# Patient Record
Sex: Female | Born: 1935 | Race: White | Hispanic: No | Marital: Married | Smoking: Former smoker
Health system: Southern US, Community
[De-identification: ages and names within clinical notes are randomized; demographics above are authoritative.]

## PROBLEM LIST (undated history)

## (undated) DIAGNOSIS — C679 Malignant neoplasm of bladder, unspecified: Secondary | ICD-10-CM

## (undated) DIAGNOSIS — M1990 Unspecified osteoarthritis, unspecified site: Secondary | ICD-10-CM

## (undated) DIAGNOSIS — I10 Essential (primary) hypertension: Secondary | ICD-10-CM

## (undated) DIAGNOSIS — F32A Depression, unspecified: Secondary | ICD-10-CM

## (undated) DIAGNOSIS — C801 Malignant (primary) neoplasm, unspecified: Secondary | ICD-10-CM

## (undated) DIAGNOSIS — F329 Major depressive disorder, single episode, unspecified: Secondary | ICD-10-CM

## (undated) HISTORY — PX: APPENDECTOMY: SHX54

## (undated) HISTORY — PX: CYSTOSCOPY TUMOR / CONDYLOMATA W/ LASER: SUR373

---
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
## 2014-01-02 ENCOUNTER — Other Ambulatory Visit: Payer: Self-pay | Admitting: Orthopedic Surgery

## 2014-01-12 ENCOUNTER — Encounter (HOSPITAL_COMMUNITY): Payer: Self-pay

## 2014-01-12 ENCOUNTER — Encounter (HOSPITAL_COMMUNITY)
Admission: RE | Admit: 2014-01-12 | Discharge: 2014-01-12 | Disposition: A | Discharge status: Home / Self Care | Payer: Medicare HMO | Source: Ambulatory Visit | Attending: Orthopedic Surgery | Admitting: Orthopedic Surgery

## 2014-01-12 ENCOUNTER — Other Ambulatory Visit: Payer: Self-pay | Admitting: Orthopedic Surgery

## 2014-01-12 DIAGNOSIS — I10 Essential (primary) hypertension: Secondary | ICD-10-CM | POA: Insufficient documentation

## 2014-01-12 DIAGNOSIS — Z01812 Encounter for preprocedural laboratory examination: Secondary | ICD-10-CM | POA: Diagnosis not present

## 2014-01-12 DIAGNOSIS — Z01818 Encounter for other preprocedural examination: Secondary | ICD-10-CM | POA: Diagnosis present

## 2014-01-12 HISTORY — DX: Malignant neoplasm of bladder, unspecified: C67.9

## 2014-01-12 HISTORY — DX: Depression, unspecified: F32.A

## 2014-01-12 HISTORY — DX: Major depressive disorder, single episode, unspecified: F32.9

## 2014-01-12 HISTORY — DX: Malignant (primary) neoplasm, unspecified: C80.1

## 2014-01-12 HISTORY — DX: Essential (primary) hypertension: I10

## 2014-01-12 HISTORY — DX: Unspecified osteoarthritis, unspecified site: M19.90

## 2014-01-12 LAB — URINALYSIS, ROUTINE W REFLEX MICROSCOPIC
Bilirubin Urine: NEGATIVE
Glucose, UA: NEGATIVE mg/dL
Hgb urine dipstick: NEGATIVE
KETONES UR: NEGATIVE mg/dL
Leukocytes, UA: NEGATIVE
NITRITE: NEGATIVE
PH: 6.5 (ref 5.0–8.0)
Protein, ur: NEGATIVE mg/dL
Specific Gravity, Urine: 1.016 (ref 1.005–1.030)
Urobilinogen, UA: 0.2 mg/dL (ref 0.0–1.0)

## 2014-01-12 LAB — CBC WITH DIFFERENTIAL/PLATELET
Basophils Absolute: 0 10*3/uL (ref 0.0–0.1)
Basophils Relative: 0 % (ref 0–1)
EOS PCT: 1 % (ref 0–5)
Eosinophils Absolute: 0.1 10*3/uL (ref 0.0–0.7)
HEMATOCRIT: 44.5 % (ref 36.0–46.0)
Hemoglobin: 15.2 g/dL — ABNORMAL HIGH (ref 12.0–15.0)
LYMPHS PCT: 41 % (ref 12–46)
Lymphs Abs: 4.7 10*3/uL — ABNORMAL HIGH (ref 0.7–4.0)
MCH: 30.5 pg (ref 26.0–34.0)
MCHC: 34.2 g/dL (ref 30.0–36.0)
MCV: 89.2 fL (ref 78.0–100.0)
MONO ABS: 0.8 10*3/uL (ref 0.1–1.0)
Monocytes Relative: 7 % (ref 3–12)
Neutro Abs: 5.9 10*3/uL (ref 1.7–7.7)
Neutrophils Relative %: 51 % (ref 43–77)
Platelets: 224 10*3/uL (ref 150–400)
RBC: 4.99 MIL/uL (ref 3.87–5.11)
RDW: 13 % (ref 11.5–15.5)
WBC: 11.4 10*3/uL — ABNORMAL HIGH (ref 4.0–10.5)

## 2014-01-12 LAB — COMPREHENSIVE METABOLIC PANEL
ALT: 45 U/L — AB (ref 0–35)
AST: 55 U/L — ABNORMAL HIGH (ref 0–37)
Albumin: 4.2 g/dL (ref 3.5–5.2)
Alkaline Phosphatase: 99 U/L (ref 39–117)
Anion gap: 16 — ABNORMAL HIGH (ref 5–15)
BUN: 12 mg/dL (ref 6–23)
CALCIUM: 9.1 mg/dL (ref 8.4–10.5)
CO2: 23 meq/L (ref 19–32)
CREATININE: 0.56 mg/dL (ref 0.50–1.10)
Chloride: 103 mEq/L (ref 96–112)
GFR, EST NON AFRICAN AMERICAN: 86 mL/min — AB (ref >90)
Glucose, Bld: 104 mg/dL — ABNORMAL HIGH (ref 70–99)
Potassium: 4 mEq/L (ref 3.7–5.3)
SODIUM: 142 meq/L (ref 137–147)
Total Bilirubin: 0.6 mg/dL (ref 0.3–1.2)
Total Protein: 7.7 g/dL (ref 6.0–8.3)

## 2014-01-12 LAB — PROTIME-INR
INR: 1.14 (ref 0.00–1.49)
Prothrombin Time: 14.6 seconds (ref 11.6–15.2)

## 2014-01-12 LAB — SURGICAL PCR SCREEN
MRSA, PCR: NEGATIVE
Staphylococcus aureus: NEGATIVE

## 2014-01-12 LAB — TYPE AND SCREEN
ABO/RH(D): O POS
Antibody Screen: NEGATIVE

## 2014-01-12 LAB — ABO/RH: ABO/RH(D): O POS

## 2014-01-12 LAB — APTT: aPTT: 32 seconds (ref 24–37)

## 2014-01-12 NOTE — Pre-Procedure Instructions (Signed)
Renee Hernandez  01/12/2014   Your procedure is scheduled on:  Monday, August 17  Report to [facility] Admitting at [phone] AM.  Call this number if you have problems the morning of surgery: [phone]   Remember:   Do not eat food or drink liquids after midnight.Sunday night   Take these medicines the morning of surgery with A SIP OF WATER:Prozac   Do not wear jewelry, make-up or nail polish.  Do not wear lotions, powders, or perfumes. You may wear deodorant.  Do not shave 48 hours prior to surgery.    Do not bring valuables to the hospital.  [facility] is not responsible for any belongings or valuables.               Contacts, dentures or bridgework may not be worn into surgery.  Leave suitcase in the car. After surgery it may be brought to your room.  For patients admitted to the hospital, discharge time is determined by your   treatment team.        Special Instructions: [facility] - Preparing for Surgery  Before surgery, you can play an important role.  Because skin is not sterile, your skin needs to be as free of germs as possible.  You can reduce the number of germs on you skin by washing with CHG (chlorahexidine gluconate) soap before surgery.  CHG is an antiseptic cleaner which kills germs and bonds with the skin to continue killing germs even after washing.  Please DO NOT use if you have an allergy to CHG or antibacterial soaps.  If your skin becomes reddened/irritated stop using the CHG and inform your nurse when you arrive at Short Stay.  Do not shave (including legs and underarms) for at least 48 hours prior to the first CHG shower.  You may shave your face.  Please follow these instructions carefully:   1.  Shower with CHG Soap the night before surgery and the   morning of Surgery.  2.  If you choose to wash your hair, wash your hair first as usual with your  normal shampoo.  3.  After you shampoo, rinse your hair and body thoroughly to remove the  Shampoo.  4.  Use CHG as you would any other liquid soap.  You can apply chg directly  to the skin and wash gently with scrungie or a clean washcloth.  5.  Apply the CHG Soap to your body ONLY FROM THE NECK DOWN.   Do not use on open wounds or open sores.  Avoid contact with your eyes,  ears, mouth and genitals (private parts).  Wash genitals (private parts)  with your normal soap.  6.  Wash thoroughly, paying special attention to the area where your surgery   will be performed.  7.  Thoroughly rinse your body with warm water from the neck down.  8.  DO NOT shower/wash with your normal soap after using and rinsing off  the CHG Soap.  9.  Pat yourself dry with a clean towel.            10.  Wear clean pajamas.            11 .  Place clean sheets on your bed the night of your first shower and do not   sleep with pets.  Day of Surgery  Do not apply any lotions/deoderants the morning of surgery.  Please wear clean clothes to the hospital/surgery center.  Please read over the following fact sheets that you were given: Pain Booklet, Coughing and Deep Breathing, Blood Transfusion Information, Total Joint Packet and Surgical Site Infection Prevention

## 2014-01-12 NOTE — Progress Notes (Signed)
Dr Ruel Favors office assistant  advised of Penicillin allergy. States chart will be updated and pre op Abx changed.

## 2014-01-13 LAB — URINE CULTURE: Colony Count: 15000

## 2014-01-16 NOTE — Progress Notes (Signed)
Anesthesia Chart Review:  Patient is a 80 year old female scheduled for right TKA on 01/22/14 by Dr. Ronnie Derby.  History includes former smoker, HTN, bladder cancer, arthritis, depression, appendectomy.  BMI is consistent with obesity. PCP is listed as Dr. Kennith Maes in [place].  Patient was referred to cardiologist Dr. Jimmie Molly (by Dr. Jone Baseman) with [facility]) for a preoperative evaluation due to HTN.  She was ultimately cleared with PRN follow-up recommended. BP at PAT was recorded at 177/93.  EKG on 12/25/13 [facility]) showed: SR, low voltage in precordial leads, non-specific T wave abnormality.  CXR on 01/12/14 showed: FINDINGS: Aortic tortuosity, dilatation, and advanced atherosclerosis. Enlarged cardiac silhouette. No confluent airspace opacity, pleural effusion, or pneumothorax. Osteopenia and mild multilevel degenerative changes. IMPRESSION: Prominent cardiomediastinal contours. Aortic atherosclerosis, tortuosity, and dilatation. No focal consolidation. (There are no comparison radiology films in PACS.) CXR film reviewed with anesthesiologist Dr. Orene Desanctis.  Findings should not interfere with proceeding with this surgery from an anesthesia standpoint.  CXR was ordered by Carlynn Spry, PA-C, so will defer additional recommendations to him and/or Dr. Ronnie Derby.  Preoperative labs noted.     George Hugh [facility] Short Stay Center/Anesthesiology Phone [phone] 01/16/2014 11:41 AM

## 2014-01-21 MED ORDER — SODIUM CHLORIDE 0.9 % IV SOLN: INTRAVENOUS | Status: DC

## 2014-01-21 MED ORDER — BUPIVACAINE LIPOSOME 1.3 % IJ SUSP
20.0000 mL | Freq: Once | INTRAMUSCULAR | Status: DC
  Filled 2014-01-21: qty 20

## 2014-01-21 MED ORDER — VANCOMYCIN HCL 10 G IV SOLR
1500.0000 mg | INTRAVENOUS | Status: AC
  Administered 2014-01-22: 1500 mg via INTRAVENOUS
  Filled 2014-01-21: qty 1500

## 2014-01-21 MED ORDER — TRANEXAMIC ACID 100 MG/ML IV SOLN
1000.0000 mg | INTRAVENOUS | Status: AC
  Administered 2014-01-22: 1000 mg via INTRAVENOUS
  Filled 2014-01-21: qty 10

## 2014-01-21 MED ORDER — CHLORHEXIDINE GLUCONATE 4 % EX LIQD
60.0000 mL | Freq: Once | CUTANEOUS | Status: DC
  Filled 2014-01-21: qty 60

## 2014-01-22 ENCOUNTER — Encounter (HOSPITAL_COMMUNITY): Payer: Self-pay | Admitting: *Deleted

## 2014-01-22 ENCOUNTER — Encounter (HOSPITAL_COMMUNITY)
Admission: RE | Disposition: A | Discharge status: Skilled Nursing Facility | Payer: Self-pay | Source: Ambulatory Visit | Attending: Orthopedic Surgery

## 2014-01-22 ENCOUNTER — Inpatient Hospital Stay (HOSPITAL_COMMUNITY)
Admission: RE | Admit: 2014-01-22 | Discharge: 2014-01-25 | DRG: 470 | Disposition: A | Discharge status: Skilled Nursing Facility | Payer: Medicare HMO | Source: Ambulatory Visit | Attending: Orthopedic Surgery | Admitting: Orthopedic Surgery

## 2014-01-22 ENCOUNTER — Inpatient Hospital Stay (HOSPITAL_COMMUNITY): Payer: Medicare HMO | Admitting: Anesthesiology

## 2014-01-22 ENCOUNTER — Encounter (HOSPITAL_COMMUNITY): Payer: Medicare HMO | Admitting: Vascular Surgery

## 2014-01-22 DIAGNOSIS — Z87891 Personal history of nicotine dependence: Secondary | ICD-10-CM

## 2014-01-22 DIAGNOSIS — Z886 Allergy status to analgesic agent status: Secondary | ICD-10-CM

## 2014-01-22 DIAGNOSIS — I10 Essential (primary) hypertension: Secondary | ICD-10-CM | POA: Diagnosis present

## 2014-01-22 DIAGNOSIS — M1710 Unilateral primary osteoarthritis, unspecified knee: Secondary | ICD-10-CM | POA: Diagnosis present

## 2014-01-22 DIAGNOSIS — D62 Acute posthemorrhagic anemia: Secondary | ICD-10-CM | POA: Diagnosis not present

## 2014-01-22 DIAGNOSIS — F3289 Other specified depressive episodes: Secondary | ICD-10-CM | POA: Diagnosis present

## 2014-01-22 DIAGNOSIS — Z96651 Presence of right artificial knee joint: Secondary | ICD-10-CM

## 2014-01-22 DIAGNOSIS — Z8551 Personal history of malignant neoplasm of bladder: Secondary | ICD-10-CM | POA: Diagnosis not present

## 2014-01-22 DIAGNOSIS — Z880 Allergy status to penicillin: Secondary | ICD-10-CM

## 2014-01-22 DIAGNOSIS — Z79899 Other long term (current) drug therapy: Secondary | ICD-10-CM

## 2014-01-22 DIAGNOSIS — Z96659 Presence of unspecified artificial knee joint: Secondary | ICD-10-CM

## 2014-01-22 DIAGNOSIS — F329 Major depressive disorder, single episode, unspecified: Secondary | ICD-10-CM | POA: Diagnosis present

## 2014-01-22 HISTORY — PX: TOTAL KNEE ARTHROPLASTY: SHX125

## 2014-01-22 LAB — CBC
HCT: 42.4 % (ref 36.0–46.0)
Hemoglobin: 13.9 g/dL (ref 12.0–15.0)
MCH: 29.9 pg (ref 26.0–34.0)
MCHC: 32.8 g/dL (ref 30.0–36.0)
MCV: 91.2 fL (ref 78.0–100.0)
Platelets: 191 K/uL (ref 150–400)
RBC: 4.65 MIL/uL (ref 3.87–5.11)
RDW: 13.3 % (ref 11.5–15.5)
WBC: 10.2 K/uL (ref 4.0–10.5)

## 2014-01-22 LAB — CREATININE, SERUM
CREATININE: 0.59 mg/dL (ref 0.50–1.10)
GFR calc Af Amer: >90 mL/min (ref >90)
GFR, EST NON AFRICAN AMERICAN: 85 mL/min — AB (ref >90)

## 2014-01-22 SURGERY — ARTHROPLASTY, KNEE, TOTAL
Anesthesia: General | Laterality: Right

## 2014-01-22 MED ORDER — SCOPOLAMINE 1 MG/3DAYS TD PT72
MEDICATED_PATCH | TRANSDERMAL | Status: AC
  Filled 2014-01-22: qty 1

## 2014-01-22 MED ORDER — ACETAMINOPHEN 650 MG RE SUPP: 650.0000 mg | Freq: Four times a day (QID) | RECTAL | Status: DC | PRN

## 2014-01-22 MED ORDER — FENTANYL CITRATE 0.05 MG/ML IJ SOLN
INTRAMUSCULAR | Status: DC | PRN
  Administered 2014-01-22: 50 ug via INTRAVENOUS
  Administered 2014-01-22: 100 ug via INTRAVENOUS

## 2014-01-22 MED ORDER — PNEUMOCOCCAL VAC POLYVALENT 25 MCG/0.5ML IJ INJ
0.5000 mL | INJECTION | INTRAMUSCULAR | Status: DC
  Filled 2014-01-22: qty 0.5

## 2014-01-22 MED ORDER — ROCURONIUM BROMIDE 100 MG/10ML IV SOLN
INTRAVENOUS | Status: DC | PRN
  Administered 2014-01-22: 50 mg via INTRAVENOUS

## 2014-01-22 MED ORDER — ACETAMINOPHEN 325 MG PO TABS: 650.0000 mg | ORAL_TABLET | Freq: Four times a day (QID) | ORAL | Status: DC | PRN

## 2014-01-22 MED ORDER — GLYCOPYRROLATE 0.2 MG/ML IJ SOLN
INTRAMUSCULAR | Status: AC
  Filled 2014-01-22: qty 3

## 2014-01-22 MED ORDER — LIDOCAINE HCL (CARDIAC) 20 MG/ML IV SOLN
INTRAVENOUS | Status: AC
  Filled 2014-01-22: qty 5

## 2014-01-22 MED ORDER — MIDAZOLAM HCL 2 MG/2ML IJ SOLN
INTRAMUSCULAR | Status: AC
  Administered 2014-01-22: 1.5 mg
  Filled 2014-01-22: qty 2

## 2014-01-22 MED ORDER — DOCUSATE SODIUM 100 MG PO CAPS
100.0000 mg | ORAL_CAPSULE | Freq: Two times a day (BID) | ORAL | Status: DC
  Administered 2014-01-22 – 2014-01-25 (×7): 100 mg via ORAL
  Filled 2014-01-22 (×7): qty 1

## 2014-01-22 MED ORDER — LACTATED RINGERS IV SOLN: Freq: Once | INTRAVENOUS | Status: DC

## 2014-01-22 MED ORDER — HYDRALAZINE HCL 20 MG/ML IJ SOLN
INTRAMUSCULAR | Status: AC
  Filled 2014-01-22: qty 1

## 2014-01-22 MED ORDER — FENTANYL CITRATE 0.05 MG/ML IJ SOLN
INTRAMUSCULAR | Status: AC
  Filled 2014-01-22: qty 5

## 2014-01-22 MED ORDER — SODIUM CHLORIDE 0.9 % IR SOLN
Status: DC | PRN
  Administered 2014-01-22: 1

## 2014-01-22 MED ORDER — ONDANSETRON HCL 4 MG/2ML IJ SOLN
INTRAMUSCULAR | Status: AC
  Filled 2014-01-22: qty 2

## 2014-01-22 MED ORDER — METOCLOPRAMIDE HCL 10 MG PO TABS
5.0000 mg | ORAL_TABLET | Freq: Three times a day (TID) | ORAL | Status: DC | PRN
Start: 2014-01-22 — End: 2014-01-25

## 2014-01-22 MED ORDER — GLYCOPYRROLATE 0.2 MG/ML IJ SOLN
INTRAMUSCULAR | Status: DC | PRN
  Administered 2014-01-22: 0.6 mg via INTRAVENOUS

## 2014-01-22 MED ORDER — LOSARTAN POTASSIUM 50 MG PO TABS: 100.0000 mg | ORAL_TABLET | Freq: Once | ORAL | Status: DC

## 2014-01-22 MED ORDER — ESMOLOL HCL 10 MG/ML IV SOLN
INTRAVENOUS | Status: AC
  Filled 2014-01-22: qty 10

## 2014-01-22 MED ORDER — FLUOXETINE HCL 20 MG PO CAPS
20.0000 mg | ORAL_CAPSULE | Freq: Every day | ORAL | Status: DC
  Administered 2014-01-23 – 2014-01-25 (×3): 20 mg via ORAL
  Filled 2014-01-22 (×3): qty 1

## 2014-01-22 MED ORDER — BUPIVACAINE LIPOSOME 1.3 % IJ SUSP
INTRAMUSCULAR | Status: DC | PRN
  Administered 2014-01-22: 20 mL

## 2014-01-22 MED ORDER — EPHEDRINE SULFATE 50 MG/ML IJ SOLN
INTRAMUSCULAR | Status: DC | PRN
  Administered 2014-01-22 (×3): 5 mg via INTRAVENOUS

## 2014-01-22 MED ORDER — MIDAZOLAM HCL 2 MG/2ML IJ SOLN: 1.5000 mg | Freq: Once | INTRAMUSCULAR | Status: AC

## 2014-01-22 MED ORDER — DEXTROSE 5 % IV SOLN
500.0000 mg | Freq: Four times a day (QID) | INTRAVENOUS | Status: DC | PRN
  Filled 2014-01-22: qty 5

## 2014-01-22 MED ORDER — HYDROCHLOROTHIAZIDE 25 MG PO TABS: 25.0000 mg | ORAL_TABLET | Freq: Once | ORAL | Status: DC

## 2014-01-22 MED ORDER — VANCOMYCIN HCL IN DEXTROSE 1-5 GM/200ML-% IV SOLN
1000.0000 mg | Freq: Two times a day (BID) | INTRAVENOUS | Status: AC
  Administered 2014-01-22: 1000 mg via INTRAVENOUS
  Filled 2014-01-22: qty 200

## 2014-01-22 MED ORDER — MIDAZOLAM HCL 2 MG/2ML IJ SOLN
INTRAMUSCULAR | Status: AC
  Filled 2014-01-22: qty 2

## 2014-01-22 MED ORDER — DEXMEDETOMIDINE HCL IN NACL 200 MCG/50ML IV SOLN
INTRAVENOUS | Status: AC
  Filled 2014-01-22: qty 50

## 2014-01-22 MED ORDER — LACTATED RINGERS IV SOLN
Freq: Once | INTRAVENOUS | Status: AC
  Administered 2014-01-22: 09:00:00 via INTRAVENOUS

## 2014-01-22 MED ORDER — ONDANSETRON HCL 4 MG PO TABS: 4.0000 mg | ORAL_TABLET | Freq: Four times a day (QID) | ORAL | Status: DC | PRN

## 2014-01-22 MED ORDER — PROMETHAZINE HCL 25 MG/ML IJ SOLN: 6.2500 mg | INTRAMUSCULAR | Status: DC | PRN

## 2014-01-22 MED ORDER — LACTATED RINGERS IV SOLN
INTRAVENOUS | Status: DC | PRN
  Administered 2014-01-22 (×2): via INTRAVENOUS

## 2014-01-22 MED ORDER — DEXMEDETOMIDINE HCL 200 MCG/2ML IV SOLN
INTRAVENOUS | Status: DC | PRN
  Administered 2014-01-22: 4 ug via INTRAVENOUS
  Administered 2014-01-22: 2 ug via INTRAVENOUS
  Administered 2014-01-22: 4 ug via INTRAVENOUS

## 2014-01-22 MED ORDER — FENTANYL CITRATE 0.05 MG/ML IJ SOLN: 25.0000 ug | INTRAMUSCULAR | Status: DC | PRN

## 2014-01-22 MED ORDER — LOSARTAN POTASSIUM-HCTZ 100-25 MG PO TABS: 1.0000 | ORAL_TABLET | Freq: Every day | ORAL | Status: DC

## 2014-01-22 MED ORDER — BUPIVACAINE-EPINEPHRINE 0.5% -1:200000 IJ SOLN
INTRAMUSCULAR | Status: DC | PRN
  Administered 2014-01-22: 30 mL

## 2014-01-22 MED ORDER — BUPIVACAINE-EPINEPHRINE (PF) 0.5% -1:200000 IJ SOLN
INTRAMUSCULAR | Status: DC | PRN
  Administered 2014-01-22: 25 mL via PERINEURAL

## 2014-01-22 MED ORDER — HYDROMORPHONE HCL PF 1 MG/ML IJ SOLN
1.0000 mg | INTRAMUSCULAR | Status: DC | PRN
  Administered 2014-01-23 – 2014-01-24 (×2): 1 mg via INTRAVENOUS
  Filled 2014-01-22 (×2): qty 1

## 2014-01-22 MED ORDER — NEOSTIGMINE METHYLSULFATE 10 MG/10ML IV SOLN
INTRAVENOUS | Status: DC | PRN
  Administered 2014-01-22: 4 mg via INTRAVENOUS

## 2014-01-22 MED ORDER — NEOSTIGMINE METHYLSULFATE 10 MG/10ML IV SOLN
INTRAVENOUS | Status: AC
  Filled 2014-01-22: qty 1

## 2014-01-22 MED ORDER — FENTANYL CITRATE 0.05 MG/ML IJ SOLN
75.0000 ug | Freq: Once | INTRAMUSCULAR | Status: AC
  Administered 2014-01-22: 75 ug via INTRAVENOUS

## 2014-01-22 MED ORDER — ENOXAPARIN SODIUM 30 MG/0.3ML ~~LOC~~ SOLN
30.0000 mg | Freq: Two times a day (BID) | SUBCUTANEOUS | Status: DC
  Administered 2014-01-23 – 2014-01-25 (×4): 30 mg via SUBCUTANEOUS
  Filled 2014-01-22 (×7): qty 0.3

## 2014-01-22 MED ORDER — HYDROCHLOROTHIAZIDE 25 MG PO TABS
25.0000 mg | ORAL_TABLET | Freq: Every day | ORAL | Status: DC
  Administered 2014-01-22 – 2014-01-25 (×4): 25 mg via ORAL
  Filled 2014-01-22 (×4): qty 1

## 2014-01-22 MED ORDER — PHENOL 1.4 % MT LIQD: 1.0000 | OROMUCOSAL | Status: DC | PRN

## 2014-01-22 MED ORDER — ONDANSETRON HCL 4 MG/2ML IJ SOLN: 4.0000 mg | Freq: Four times a day (QID) | INTRAMUSCULAR | Status: DC | PRN

## 2014-01-22 MED ORDER — LIDOCAINE HCL (CARDIAC) 20 MG/ML IV SOLN
INTRAVENOUS | Status: DC | PRN
  Administered 2014-01-22: 40 mg via INTRAVENOUS

## 2014-01-22 MED ORDER — DEXAMETHASONE SODIUM PHOSPHATE 4 MG/ML IJ SOLN
INTRAMUSCULAR | Status: AC
  Filled 2014-01-22: qty 1

## 2014-01-22 MED ORDER — BISACODYL 5 MG PO TBEC: 5.0000 mg | DELAYED_RELEASE_TABLET | Freq: Every day | ORAL | Status: DC | PRN

## 2014-01-22 MED ORDER — ROCURONIUM BROMIDE 50 MG/5ML IV SOLN
INTRAVENOUS | Status: AC
  Filled 2014-01-22: qty 1

## 2014-01-22 MED ORDER — METOCLOPRAMIDE HCL 5 MG/ML IJ SOLN
5.0000 mg | Freq: Three times a day (TID) | INTRAMUSCULAR | Status: DC | PRN
Start: 2014-01-22 — End: 2014-01-25

## 2014-01-22 MED ORDER — MEPERIDINE HCL 25 MG/ML IJ SOLN: 6.2500 mg | INTRAMUSCULAR | Status: DC | PRN

## 2014-01-22 MED ORDER — MENTHOL 3 MG MT LOZG
1.0000 | LOZENGE | OROMUCOSAL | Status: DC | PRN
Start: 2014-01-22 — End: 2014-01-25

## 2014-01-22 MED ORDER — DIPHENHYDRAMINE HCL 12.5 MG/5ML PO ELIX: 12.5000 mg | ORAL_SOLUTION | ORAL | Status: DC | PRN

## 2014-01-22 MED ORDER — SENNOSIDES-DOCUSATE SODIUM 8.6-50 MG PO TABS
1.0000 | ORAL_TABLET | Freq: Every evening | ORAL | Status: DC | PRN
Start: 2014-01-22 — End: 2014-01-25

## 2014-01-22 MED ORDER — FUROSEMIDE 20 MG PO TABS
20.0000 mg | ORAL_TABLET | Freq: Every day | ORAL | Status: DC
  Administered 2014-01-22 – 2014-01-25 (×4): 20 mg via ORAL
  Filled 2014-01-22 (×4): qty 1

## 2014-01-22 MED ORDER — DIPHENHYDRAMINE HCL 50 MG/ML IJ SOLN
INTRAMUSCULAR | Status: DC | PRN
  Administered 2014-01-22: 6 mg via INTRAVENOUS

## 2014-01-22 MED ORDER — ZOLPIDEM TARTRATE 5 MG PO TABS: 5.0000 mg | ORAL_TABLET | Freq: Every evening | ORAL | Status: DC | PRN

## 2014-01-22 MED ORDER — SCOPOLAMINE 1 MG/3DAYS TD PT72
1.0000 | MEDICATED_PATCH | TRANSDERMAL | Status: DC
  Administered 2014-01-22: 1 via TRANSDERMAL

## 2014-01-22 MED ORDER — FENTANYL CITRATE 0.05 MG/ML IJ SOLN
INTRAMUSCULAR | Status: AC
  Administered 2014-01-22: 75 ug via INTRAVENOUS
  Filled 2014-01-22: qty 2

## 2014-01-22 MED ORDER — PROPOFOL 10 MG/ML IV BOLUS
INTRAVENOUS | Status: AC
  Filled 2014-01-22: qty 20

## 2014-01-22 MED ORDER — BUPIVACAINE-EPINEPHRINE (PF) 0.5% -1:200000 IJ SOLN
INTRAMUSCULAR | Status: AC
  Filled 2014-01-22: qty 30

## 2014-01-22 MED ORDER — ALUM & MAG HYDROXIDE-SIMETH 200-200-20 MG/5ML PO SUSP
30.0000 mL | ORAL | Status: DC | PRN
Start: 2014-01-22 — End: 2014-01-25

## 2014-01-22 MED ORDER — LOSARTAN POTASSIUM 50 MG PO TABS
100.0000 mg | ORAL_TABLET | Freq: Every day | ORAL | Status: DC
  Administered 2014-01-22 – 2014-01-25 (×4): 100 mg via ORAL
  Filled 2014-01-22 (×4): qty 2

## 2014-01-22 MED ORDER — OXYCODONE HCL ER 10 MG PO T12A
10.0000 mg | EXTENDED_RELEASE_TABLET | Freq: Two times a day (BID) | ORAL | Status: DC
  Administered 2014-01-22 – 2014-01-23 (×2): 10 mg via ORAL
  Filled 2014-01-22 (×2): qty 1

## 2014-01-22 MED ORDER — OXYCODONE HCL 5 MG PO TABS
5.0000 mg | ORAL_TABLET | ORAL | Status: DC | PRN
  Administered 2014-01-22 – 2014-01-23 (×4): 10 mg via ORAL
  Filled 2014-01-22 (×3): qty 2

## 2014-01-22 MED ORDER — PROPOFOL 10 MG/ML IV BOLUS
INTRAVENOUS | Status: DC | PRN
  Administered 2014-01-22: 130 mg via INTRAVENOUS

## 2014-01-22 MED ORDER — ONDANSETRON HCL 4 MG/2ML IJ SOLN
INTRAMUSCULAR | Status: DC | PRN
  Administered 2014-01-22: 4 mg via INTRAVENOUS

## 2014-01-22 MED ORDER — OXYCODONE HCL 5 MG PO TABS
ORAL_TABLET | ORAL | Status: AC
  Administered 2014-01-22: 10 mg via ORAL
  Filled 2014-01-22: qty 2

## 2014-01-22 MED ORDER — SODIUM CHLORIDE 0.9 % IV SOLN
INTRAVENOUS | Status: DC
  Administered 2014-01-22: 15:00:00 via INTRAVENOUS

## 2014-01-22 MED ORDER — METHOCARBAMOL 500 MG PO TABS
500.0000 mg | ORAL_TABLET | Freq: Four times a day (QID) | ORAL | Status: DC | PRN
  Administered 2014-01-22 (×2): 500 mg via ORAL
  Filled 2014-01-22: qty 1

## 2014-01-22 MED ORDER — FLEET ENEMA 7-19 GM/118ML RE ENEM: 1.0000 | ENEMA | Freq: Once | RECTAL | Status: AC | PRN

## 2014-01-22 MED ORDER — HYDRALAZINE HCL 20 MG/ML IJ SOLN
INTRAMUSCULAR | Status: DC | PRN
  Administered 2014-01-22 (×4): 5 mg via INTRAVENOUS

## 2014-01-22 MED ORDER — METHOCARBAMOL 500 MG PO TABS
ORAL_TABLET | ORAL | Status: AC
  Administered 2014-01-22: 500 mg via ORAL
  Filled 2014-01-22: qty 1

## 2014-01-22 MED ORDER — DEXAMETHASONE SODIUM PHOSPHATE 4 MG/ML IJ SOLN
INTRAMUSCULAR | Status: DC | PRN
  Administered 2014-01-22: 4 mg via INTRAVENOUS

## 2014-01-22 MED ORDER — DIPHENHYDRAMINE HCL 50 MG/ML IJ SOLN
INTRAMUSCULAR | Status: AC
  Filled 2014-01-22: qty 1

## 2014-01-22 SURGICAL SUPPLY — 58 items
BANDAGE ESMARK 6X9 LF (GAUZE/BANDAGES/DRESSINGS) ×1 IMPLANT
BLADE SAGITTAL 13X1.27X60 (BLADE) ×2 IMPLANT
BLADE SAW SGTL 83.5X18.5 (BLADE) ×2 IMPLANT
BNDG ADH 5X4 AIR PERM ELC (GAUZE/BANDAGES/DRESSINGS) ×1
BNDG CMPR 9X6 STRL LF SNTH (GAUZE/BANDAGES/DRESSINGS) ×1
BNDG COHESIVE 4X5 WHT NS (GAUZE/BANDAGES/DRESSINGS) ×1 IMPLANT
BNDG ESMARK 6X9 LF (GAUZE/BANDAGES/DRESSINGS) ×2
BOWL SMART MIX CTS (DISPOSABLE) ×2 IMPLANT
CAP POR TM CP VIT E LN CER HD ×1 IMPLANT
CEMENT BONE SIMPLEX SPEEDSET (Cement) ×4 IMPLANT
COVER SURGICAL LIGHT HANDLE (MISCELLANEOUS) ×2 IMPLANT
CUFF TOURNIQUET SINGLE 34IN LL (TOURNIQUET CUFF) ×2 IMPLANT
DRAPE EXTREMITY T 121X128X90 (DRAPE) ×2 IMPLANT
DRAPE INCISE IOBAN 66X45 STRL (DRAPES) ×4 IMPLANT
DRAPE PROXIMA HALF (DRAPES) ×2 IMPLANT
DRAPE U-SHAPE 47X51 STRL (DRAPES) ×2 IMPLANT
DRSG ADAPTIC 3X8 NADH LF (GAUZE/BANDAGES/DRESSINGS) ×2 IMPLANT
DRSG PAD ABDOMINAL 8X10 ST (GAUZE/BANDAGES/DRESSINGS) ×2 IMPLANT
DURAPREP 26ML APPLICATOR (WOUND CARE) ×4 IMPLANT
ELECT REM PT RETURN 9FT ADLT (ELECTROSURGICAL) ×2
ELECTRODE REM PT RTRN 9FT ADLT (ELECTROSURGICAL) ×1 IMPLANT
EVACUATOR 1/8 PVC DRAIN (DRAIN) ×2 IMPLANT
GAUZE SPONGE 4X4 12PLY STRL (GAUZE/BANDAGES/DRESSINGS) ×2 IMPLANT
GAUZE SPONGE 4X4 16PLY XRAY LF (GAUZE/BANDAGES/DRESSINGS) ×1 IMPLANT
GLOVE BIOGEL M 7.0 STRL (GLOVE) ×3 IMPLANT
GLOVE BIOGEL PI IND STRL 7.5 (GLOVE) IMPLANT
GLOVE BIOGEL PI IND STRL 8.5 (GLOVE) ×2 IMPLANT
GLOVE BIOGEL PI INDICATOR 7.5 (GLOVE) ×3
GLOVE BIOGEL PI INDICATOR 8.5 (GLOVE) ×4
GLOVE SURG ORTHO 8.0 STRL STRW (GLOVE) ×5 IMPLANT
GOWN STRL REUS W/ TWL LRG LVL3 (GOWN DISPOSABLE) ×2 IMPLANT
GOWN STRL REUS W/ TWL XL LVL3 (GOWN DISPOSABLE) ×2 IMPLANT
GOWN STRL REUS W/TWL LRG LVL3 (GOWN DISPOSABLE) ×4
GOWN STRL REUS W/TWL XL LVL3 (GOWN DISPOSABLE) ×4
HANDPIECE INTERPULSE COAX TIP (DISPOSABLE) ×2
HOOD PEEL AWAY FACE SHEILD DIS (HOOD) ×8 IMPLANT
KIT BASIN OR (CUSTOM PROCEDURE TRAY) ×2 IMPLANT
KIT ROOM TURNOVER OR (KITS) ×2 IMPLANT
MANIFOLD NEPTUNE II (INSTRUMENTS) ×2 IMPLANT
NEEDLE 22X1 1/2 (OR ONLY) (NEEDLE) ×4 IMPLANT
NS IRRIG 1000ML POUR BTL (IV SOLUTION) ×2 IMPLANT
PACK TOTAL JOINT (CUSTOM PROCEDURE TRAY) ×2 IMPLANT
PAD ARMBOARD 7.5X6 YLW CONV (MISCELLANEOUS) ×4 IMPLANT
PADDING CAST COTTON 6X4 STRL (CAST SUPPLIES) ×2 IMPLANT
SET HNDPC FAN SPRY TIP SCT (DISPOSABLE) ×1 IMPLANT
STAPLER VISISTAT 35W (STAPLE) ×2 IMPLANT
SUCTION FRAZIER TIP 10 FR DISP (SUCTIONS) ×2 IMPLANT
SUT BONE WAX W31G (SUTURE) ×2 IMPLANT
SUT VIC AB 0 CTB1 27 (SUTURE) ×4 IMPLANT
SUT VIC AB 1 CT1 27 (SUTURE) ×4
SUT VIC AB 1 CT1 27XBRD ANBCTR (SUTURE) ×2 IMPLANT
SUT VIC AB 2-0 CT1 27 (SUTURE) ×4
SUT VIC AB 2-0 CT1 TAPERPNT 27 (SUTURE) ×2 IMPLANT
SYR 20CC LL (SYRINGE) ×4 IMPLANT
TOWEL OR 17X24 6PK STRL BLUE (TOWEL DISPOSABLE) ×2 IMPLANT
TOWEL OR 17X26 10 PK STRL BLUE (TOWEL DISPOSABLE) ×2 IMPLANT
TRAY FOLEY CATH 14FR (SET/KITS/TRAYS/PACK) ×2 IMPLANT
WATER STERILE IRR 1000ML POUR (IV SOLUTION) ×4 IMPLANT

## 2014-01-22 NOTE — Transfer of Care (Signed)
Immediate Anesthesia Transfer of Care Note  Patient: Renee Hernandez  Procedure(s) Performed: Procedure(s): TOTAL KNEE ARTHROPLASTY (Right)  Patient Location: PACU  Anesthesia Type:General and Regional  Level of Consciousness: awake, alert , oriented and sedated  Airway & Oxygen Therapy: Patient Spontanous Breathing and Patient connected to face mask oxygen  Post-op Assessment: Report given to PACU RN, Post -op Vital signs reviewed and stable and Patient moving all extremities  Post vital signs: Reviewed and stable  Complications: No apparent anesthesia complications

## 2014-01-22 NOTE — Progress Notes (Signed)
Dr. Tresa Moore anesthesia MD taking over pt, orders cancelled for home dose of hyzaar.

## 2014-01-22 NOTE — Progress Notes (Signed)
Clinical Social Work Department BRIEF PSYCHOSOCIAL ASSESSMENT 01/22/2014  Patient:  Renee Hernandez, Renee Hernandez     Account Number:  [id number]     Admit date:  01/22/2014  Clinical Social Worker:  Jennette Dubin  Date/Time:  01/22/2014 04:17 PM  Referred by:  Physician  Date Referred:  01/22/2014 Referred for  SNF Placement   Other Referral:   Interview type:  Family Other interview type:    PSYCHOSOCIAL DATA Living Status:  HUSBAND Admitted from facility:   Level of care:   Primary support name:  Gabryela Kimbrell Primary support relationship to patient:  SPOUSE Degree of support available:   Good support    CURRENT CONCERNS  Other Concerns:    SOCIAL WORK ASSESSMENT / PLAN Pt admitted to hospital today due to right knee replacement. Pt currently lives with husband who feels he may need assistance in the home. Pt not sure if she would need a SNF but would like to go to [facility] in [place], [place] if need arises. Pt anticpates going home.   Assessment/plan status:  Psychosocial Support/Ongoing Assessment of Needs Other assessment/ plan:   N/A   Information/referral to community resources:   N/A    PATIENT'S/FAMILY'S RESPONSE TO PLAN OF CARE: Pt will continue to work with PT to determine level of care needed. SW will continue to follow.    Charlene Brooke, MSW Clinical Social Worker [phone]

## 2014-01-22 NOTE — Progress Notes (Signed)
Orthopedic Tech Progress Note Patient Details:  Renee Hernandez 1932-11-25 [id number]  CPM Right Knee CPM Right Knee: On Right Knee Flexion (Degrees): 90 Right Knee Extension (Degrees): 0 Additional Comments: Trapeze bar and foot roll   Cammer, Theodoro Parma 01/22/2014, 12:23 PM

## 2014-01-22 NOTE — Anesthesia Preprocedure Evaluation (Addendum)
Anesthesia Evaluation  Patient identified by MRN, date of birth, ID band Patient awake  General Assessment Comment:BMI 46, Denies OSA  Reviewed: Allergy & Precautions, H&P , NPO status , Patient's Chart, lab work & pertinent test results, reviewed documented beta blocker date and time   Airway Mallampati: II TM Distance: >3 FB Neck ROM: Full    Dental  (+) Upper Dentures, Lower Dentures   Pulmonary former smoker (7 pack year hx),          Cardiovascular hypertension, Pt. on medications Rhythm:Regular Rate:Normal     Neuro/Psych    GI/Hepatic   Endo/Other    Renal/GU      Musculoskeletal   Abdominal (+)  Abdomen: soft.    Peds  Hematology   Anesthesia Other Findings   Reproductive/Obstetrics                          Anesthesia Physical Anesthesia Plan  ASA: III  Anesthesia Plan: General   Post-op Pain Management: MAC Combined w/ Regional for Post-op pain   Induction: Intravenous  Airway Management Planned: Oral ETT  Additional Equipment:   Intra-op Plan:   Post-operative Plan: Extubation in OR  Informed Consent: I have reviewed the patients History and Physical, chart, labs and discussed the procedure including the risks, benefits and alternatives for the proposed anesthesia with the patient or authorized representative who has indicated his/her understanding and acceptance.     Plan Discussed with:   Anesthesia Plan Comments:         Anesthesia Quick Evaluation

## 2014-01-22 NOTE — Care Management Note (Signed)
CARE MANAGEMENT NOTE 01/22/2014  Patient:  Renee Hernandez, Renee Hernandez   Account Number:  [id number]  Date Initiated:  01/22/2014  Documentation initiated by:  Ricki Miller  Subjective/Objective Assessment:   80 yr old female s/p right total knee arthroplasty.     Action/Plan:   PT/OT eval  Patient preoperatively setup with [facility], may need SNF.   Anticipated DC Date:     Anticipated DC Plan:    In-house referral  Clinical Social Worker      DC Planning Services  CM consult      Choice offered to / List presented to:             Status of service:  In process, will continue to follow Medicare Important Message given?   (If response is "NO", the following Medicare IM given date fields will be blank) Date Medicare IM given:   Medicare IM given by:   Date Additional Medicare IM given:   Additional Medicare IM given by:    Discharge Disposition:    Per UR Regulation:  Reviewed for med. necessity/level of care/duration of stay

## 2014-01-22 NOTE — Progress Notes (Signed)
Dr. Linna Caprice made aware of BP 217/112, orders to give home dose of hyzaar given.  Dr. Ronnie Derby in room seeing pt and made aware of BP.

## 2014-01-22 NOTE — Anesthesia Postprocedure Evaluation (Signed)
  Anesthesia Post-op Note  Patient: Renee Hernandez  Procedure(s) Performed: Procedure(s): TOTAL KNEE ARTHROPLASTY (Right)  Patient Location: PACU  Anesthesia Type:General  Level of Consciousness: awake and alert   Airway and Oxygen Therapy: Patient Spontanous Breathing and Patient connected to nasal cannula oxygen  Post-op Pain: mild  Post-op Assessment: Post-op Vital signs reviewed, Patient's Cardiovascular Status Stable and Respiratory Function Stable  Post-op Vital Signs: Reviewed and stable  Last Vitals:  Filed Vitals:   01/22/14 1200  BP: 152/74  Pulse: 81  Temp: 36.7 C  Resp: 15    Complications: No apparent anesthesia complications

## 2014-01-22 NOTE — Anesthesia Procedure Notes (Addendum)
Anesthesia Regional Block:  Adductor canal block  Pre-Anesthetic Checklist: ,, timeout performed,, Correct Site,, Correct Procedure,, site marked,,, surgical consent,, at surgeon's request  Laterality: Right  Prep: Maximum Sterile Barrier Precautions used and chloraprep       Needles:  Injection technique: Single-shot  Needle Type: Echogenic Stimulator Needle     Needle Length: 5cm 5 cm Needle Gauge: 22 and 22 G  Needle insertion depth: 5 cm   Additional Needles:  Procedures: ultrasound guided (picture in chart) Adductor canal block Narrative:    Procedure Name: Intubation Date/Time: 01/22/2014 8:55 AM Performed by: Ebbie Latus E Pre-anesthesia Checklist: Patient identified, Emergency Drugs available, Suction available, Patient being monitored and Timeout performed Patient Re-evaluated:Patient Re-evaluated prior to inductionOxygen Delivery Method: Circle system utilized Preoxygenation: Pre-oxygenation with 100% oxygen Intubation Type: IV induction Ventilation: Mask ventilation without difficulty Laryngoscope Size: Miller and 2 Grade View: Grade I Tube type: Oral Tube size: 7.5 mm Number of attempts: 1 Airway Equipment and Method: Stylet Placement Confirmation: ETT inserted through vocal cords under direct vision,  positive ETCO2 and breath sounds checked- equal and bilateral Secured at: 22 cm Tube secured with: Tape Dental Injury: Teeth and Oropharynx as per pre-operative assessment

## 2014-01-22 NOTE — Progress Notes (Signed)
Utilization review completed.  

## 2014-01-22 NOTE — Evaluation (Signed)
Physical Therapy Evaluation Patient Details Name: Renee Hernandez MRN: [id number] DOB: August 17, 1932 Today's Date: 01/22/2014   History of Present Illness  pt presents with R TKA.    Clinical Impression  Pt still drowsy post-op, but is agreeable to mobility and does participate with mobility.  Pt and husband unsure on whether husband will be able to manage pt at home and may need SNF to maximize independence prior to returning to home.  Will continue to follow.      Follow Up Recommendations SNF    Equipment Recommendations  None recommended by PT    Recommendations for Other Services       Precautions / Restrictions Precautions Precautions: Fall Restrictions Weight Bearing Restrictions: Yes RLE Weight Bearing: Weight bearing as tolerated      Mobility  Bed Mobility Overal bed mobility: Needs Assistance Bed Mobility: Supine to Sit     Supine to sit: Mod assist;HOB elevated     General bed mobility comments: cues for sequencing.  A for R LE and bringing trunk up to sitting.    Transfers Overall transfer level: Needs assistance Equipment used: Rolling walker (2 wheeled) Transfers: Sit to/from [facility] Sit to Stand: Mod assist Stand pivot transfers: Min assist       General transfer comment: cues for UE use and positioning of LEs.  Cues for step-by-step through pivot.    Ambulation/Gait                Stairs            Wheelchair Mobility    Modified Rankin (Stroke Patients Only)       Balance                                             Pertinent Vitals/Pain Pain Assessment: 0-10 Pain Score: 4  Pain Location: R knee Pain Descriptors / Indicators: Sore Pain Intervention(s): Premedicated before session;Repositioned    Home Living Family/patient expects to be discharged to:: Private residence Living Arrangements: Spouse/significant other Available Help at Discharge: Family;Available 24 hours/day Type of  Home: House Home Access: Stairs to enter Entrance Stairs-Rails: None Entrance Stairs-Number of Steps: 1 Home Layout: One level Home Equipment: Walker - 2 wheels;Bedside commode      Prior Function Level of Independence: Independent               Hand Dominance        Extremity/Trunk Assessment   Upper Extremity Assessment: Defer to OT evaluation           Lower Extremity Assessment: RLE deficits/detail RLE Deficits / Details: AAROM 10- 70    Cervical / Trunk Assessment: Normal  Communication   Communication: No difficulties  Cognition Arousal/Alertness: Awake/alert Behavior During Therapy: WFL for tasks assessed/performed Overall Cognitive Status: Within Functional Limits for tasks assessed                      General Comments      Exercises        Assessment/Plan    PT Assessment Patient needs continued PT services  PT Diagnosis Difficulty walking;Acute pain   PT Problem List Decreased strength;Decreased range of motion;Decreased activity tolerance;Decreased balance;Decreased mobility;Decreased coordination;Decreased knowledge of use of DME  PT Treatment Interventions DME instruction;Gait training;Stair training;Functional mobility training;Therapeutic activities;Therapeutic exercise;Balance training;Patient/family education   PT Goals (Current goals can  be found in the Care Plan section) Acute Rehab PT Goals PT Goal Formulation: With patient Time For Goal Achievement: 01/29/14 Potential to Achieve Goals: Good    Frequency 7X/week   Barriers to discharge        Co-evaluation               End of Session Equipment Utilized During Treatment: Gait belt Activity Tolerance: Patient tolerated treatment well Patient left: in chair;with call bell/phone within reach;with family/visitor present Nurse Communication: Mobility status         Time: 3343-5686 PT Time Calculation (min): 25 min   Charges:   PT Evaluation $Initial PT  Evaluation Tier I: 1 Procedure PT Treatments $Therapeutic Activity: 8-22 mins   PT G CodesCatarina Hartshorn, [place] 01/22/2014, 3:06 PM

## 2014-01-22 NOTE — H&P (Signed)
Renee Hernandez MRN:  [id number] DOB/SEX:  01-Nov-1932/female  CHIEF COMPLAINT:  Painful right Knee  HISTORY: Patient is a 80 y.o. female presented with a history of pain in the right knee. Onset of symptoms was gradual starting several years ago with gradually worsening course since that time. Prior procedures on the knee include none. Patient has been treated conservatively with over-the-counter NSAIDs and activity modification. Patient currently rates pain in the knee at 10 out of 10 with activity. There is pain at night.  PAST MEDICAL HISTORY: There are no active problems to display for this patient.  Past Medical History  Diagnosis Date  . Depression   . Arthritis   . Hypertension   . Cancer   . Bladder cancer    Past Surgical History  Procedure Laterality Date  . Ovarian cyst surgery Right 1952  . Appendectomy    . 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 FLUoxetine (PROZAC) 20 MG capsule Take 20 mg by mouth daily.      . furosemide (LASIX) 20 MG tablet Take 20 mg by mouth daily.      Marland Kitchen HYDROcodone-acetaminophen (NORCO/VICODIN) 5-325 MG per tablet Take 1 tablet by mouth every 6 (six) hours as needed for moderate pain.      Marland Kitchen losartan-hydrochlorothiazide (HYZAAR) 100-25 MG per tablet Take 1 tablet by mouth daily.        ALLERGIES:   Allergies  Allergen Reactions  . Aspirin Other (See Comments)    ulcers  . Penicillins Swelling    Tongue swelled badly    REVIEW OF SYSTEMS:  Pertinent items are noted in HPI.   FAMILY HISTORY:  No family history on file.  SOCIAL HISTORY:   History  Substance Use Topics  . Smoking status: Former Smoker -- 0.50 packs/day for 14 years    Types: Cigarettes  . Smokeless tobacco: Not on file     Comment: quit smoking in 1970  . Alcohol Use: No     EXAMINATION:  Vital signs in last 24 hours:    General appearance: alert, cooperative  and no distress Lungs: clear to auscultation bilaterally Heart: regular rate and rhythm, S1, S2 normal, no murmur, click, rub or gallop Abdomen: soft, non-tender; bowel sounds normal; no masses,  no organomegaly Extremities: extremities normal, atraumatic, no cyanosis or edema and Homans sign is negative, no sign of DVT Pulses: 2+ and symmetric Skin: Skin color, texture, turgor normal. No rashes or lesions Neurologic: Alert and oriented X 3, normal strength and tone. Normal symmetric reflexes. Normal coordination and gait  Musculoskeletal:  ROM 0-110, Ligaments intact,  Imaging Review Plain radiographs demonstrate severe degenerative joint disease of the right knee. The overall alignment is significant valgus. The bone quality appears to be good for age and reported activity level.  Assessment/Plan: End stage arthritis, right knee   The patient history, physical examination and imaging studies are consistent with advanced degenerative joint disease of the right knee. The patient has failed conservative treatment.  The clearance notes were reviewed.  After discussion with the patient it was felt that Total Knee Replacement was indicated. The procedure,  risks, and benefits of total knee arthroplasty were presented and reviewed. The risks including but not limited to aseptic loosening, infection, blood clots, vascular injury, stiffness, patella tracking problems complications among others were discussed. The patient acknowledged the explanation, agreed to proceed with the plan.  Brodee Mauritz 01/22/2014, 6:45 AM

## 2014-01-23 MED ORDER — HYDROCODONE-ACETAMINOPHEN 5-325 MG PO TABS
1.0000 | ORAL_TABLET | ORAL | Status: DC | PRN
  Administered 2014-01-24: 1 via ORAL
  Filled 2014-01-23: qty 2

## 2014-01-23 NOTE — Progress Notes (Signed)
Physical Therapy Treatment Patient Details Name: Renee Hernandez MRN: [id number] DOB: 03/23/1933 Today's Date: 01/23/2014    History of Present Illness pt presents with R TKA.      PT Comments    Pt continues to be very drowsy and needs consistent cueing to maintain arousal.  Pt able to ambulate short distance this am, but needs consistent cueing.  If pt becomes more alert and able to improve mobility, may be able to consider D/C to home, but at this time pt will need SNF for further rehab.  Will continue to follow.    Follow Up Recommendations  SNF     Equipment Recommendations  None recommended by PT    Recommendations for Other Services       Precautions / Restrictions Precautions Precautions: Fall Restrictions Weight Bearing Restrictions: Yes RLE Weight Bearing: Weight bearing as tolerated    Mobility  Bed Mobility                  Transfers Overall transfer level: Needs assistance Equipment used: Rolling walker (2 wheeled) Transfers: Sit to/from Stand Sit to Stand: Mod assist         General transfer comment: cues for UE use and positioning of LEs.    Ambulation/Gait Ambulation/Gait assistance: Min assist Ambulation Distance (Feet): 15 Feet Assistive device: Rolling walker (2 wheeled) Gait Pattern/deviations: Step-to pattern;Decreased step length - left;Decreased stance time - right;Decreased stride length;Trunk flexed   Gait velocity interpretation: Below normal speed for age/gender General Gait Details: pt moves slowly and needs max cueing for arousal and attention to task.  cues for safe use of RW, upright posture.     Stairs            Wheelchair Mobility    Modified Rankin (Stroke Patients Only)       Balance                                    Cognition Arousal/Alertness: Awake/alert Behavior During Therapy: WFL for tasks assessed/performed Overall Cognitive Status: Within Functional Limits for tasks assessed                      Exercises Total Joint Exercises Ankle Circles/Pumps: AROM;Both;10 reps Long Arc Quad: AAROM;Right;10 reps Knee Flexion: AAROM;Right;10 reps Goniometric ROM: ~10 - 80    General Comments        Pertinent Vitals/Pain Pain Assessment: No/denies pain    Home Living                      Prior Function            PT Goals (current goals can now be found in the care plan section) Acute Rehab PT Goals PT Goal Formulation: With patient Time For Goal Achievement: 01/29/14 Potential to Achieve Goals: Good Progress towards PT goals: Progressing toward goals    Frequency  7X/week    PT Plan Current plan remains appropriate    Co-evaluation             End of Session Equipment Utilized During Treatment: Gait belt Activity Tolerance: Patient tolerated treatment well Patient left: in chair;with call bell/phone within reach;with family/visitor present     Time: 0836-0900 PT Time Calculation (min): 24 min  Charges:  $Gait Training: 8-22 mins $Therapeutic Exercise: 8-22 mins  G CodesCatarina Hartshorn, [place] 01/23/2014, 9:24 AM

## 2014-01-23 NOTE — Evaluation (Signed)
Occupational Therapy Evaluation Patient Details Name: Renee Hernandez MRN: [id number] DOB: [birth year] Today's Date: 01/23/2014    History of Present Illness pt presents with R TKA.     Clinical Impression   Pt admitted with the above diagnoses and presents with below problem list. Pt will benefit from continued acute OT to address the below listed deficits and maximize independence with basic ADLs prior to d/c to next venue. PTA pt was mod I with ADLs using a rw. Pt currently needing mod A with +2 for safety/equipment for LB ADLs; min A for functional mobility for balance. Discussed d/c recommendation with pt and spouse with both indicating agreement for SNF at d/c prior to returning home.      Follow Up Recommendations  SNF    Equipment Recommendations  Other (comment) (defer to next venue)    Recommendations for Other Services       Precautions / Restrictions Precautions Precautions: Fall Restrictions Weight Bearing Restrictions: Yes RLE Weight Bearing: Weight bearing as tolerated      Mobility Bed Mobility               General bed mobility comments: not assessed - pt in recliner  Transfers Overall transfer level: Needs assistance Equipment used: Rolling walker (2 wheeled) Transfers: Sit to/from [facility] Sit to Stand: Mod assist Stand pivot transfers: Min assist       General transfer comment: cues for safety due to lethergy, cues for technique     Balance Overall balance assessment: Needs assistance Sitting-balance support: Bilateral upper extremity supported;Feet supported Sitting balance-Leahy Scale: Fair     Standing balance support: Bilateral upper extremity supported;During functional activity Standing balance-Leahy Scale: Poor Standing balance comment: relies on rw for balance                            ADL Overall ADL's : Needs assistance/impaired Eating/Feeding: Set up;Sitting   Grooming: Set up;Sitting    Upper Body Bathing: Set up;Sitting   Lower Body Bathing: Moderate assistance;+2 for safety/equipment;Sit to/from stand   Upper Body Dressing : Set up;Sitting   Lower Body Dressing: Moderate assistance;+2 for safety/equipment;Sit to/from stand   Toilet Transfer: +2 for safety/equipment;Minimal assistance;Ambulation;RW (3n1 over toilet)   Toileting- Clothing Manipulation and Hygiene: Moderate assistance;+2 for safety/equipment;Sit to/from stand   Tub/ Banker: Moderate assistance;+2 for safety/equipment;Ambulation;3 in 1;Rolling walker   Functional mobility during ADLs: Minimal assistance;Moderate assistance;+2 for safety/equipment;Rolling walker;Cueing for safety General ADL Comments: Pt lethargic during session with eyes closed a signifiacant part of session. Pt ambulated to 3n1 over toilet with min A for balance, cues for safety due to lethargy, and +2 for safety/equipment. Mod A for transfers.     Vision                     Perception     Praxis      Pertinent Vitals/Pain Pain Assessment: No/denies pain     Hand Dominance Right   Extremity/Trunk Assessment Upper Extremity Assessment Upper Extremity Assessment: Overall WFL for tasks assessed;Generalized weakness   Lower Extremity Assessment Lower Extremity Assessment: Defer to PT evaluation       Communication Communication Communication: No difficulties   Cognition Arousal/Alertness: Lethargic;Suspect due to medications Behavior During Therapy: [facility] for tasks assessed/performed Overall Cognitive Status: Within Functional Limits for tasks assessed  General Comments       Exercises Exercises: Total Joint     Shoulder Instructions      Home Living Family/patient expects to be discharged to:: Skilled nursing facility Living Arrangements: Spouse/significant other Available Help at Discharge: Family;Available 24 hours/day Type of Home: House Home Access: Stairs to  enter [employer] of Steps: 1 Entrance Stairs-Rails: None Home Layout: One level               Home Equipment: Walker - 2 wheels;Bedside commode          Prior Functioning/Environment Level of Independence: Independent with assistive device(s)        Comments: used rw due to knee pain    OT Diagnosis: Acute pain;Generalized weakness   OT Problem List: Impaired balance (sitting and/or standing);Decreased knowledge of use of DME or AE;Decreased knowledge of precautions;Obesity;Pain   OT Treatment/Interventions: Self-care/ADL training;Therapeutic exercise;DME and/or AE instruction;Therapeutic activities;Patient/family education;Balance training    OT Goals(Current goals can be found in the care plan section) Acute Rehab OT Goals Patient Stated Goal: not stated OT Goal Formulation: With patient/family Time For Goal Achievement: 01/30/14 Potential to Achieve Goals: Good ADL Goals Pt Will Perform Lower Body Bathing: with min guard assist;with adaptive equipment;sit to/from stand Pt Will Perform Lower Body Dressing: with min guard assist;with adaptive equipment;sit to/from stand Pt Will Transfer to Toilet: with min guard assist;ambulating (3n1 over toilet) Pt Will Perform Toileting - Clothing Manipulation and hygiene: with min guard assist;with adaptive equipment;sit to/from stand Pt Will Perform Tub/Shower Transfer: with supervision;ambulating;3 in 1;rolling walker  OT Frequency: Min 3X/week   Barriers to D/C:            Co-evaluation              End of Session Equipment Utilized During Treatment: Gait belt;Rolling walker  Activity Tolerance: Patient tolerated treatment well;Patient limited by lethargy Patient left: in chair;with call bell/phone within reach;with family/visitor present   Time: 1137-1210 OT Time Calculation (min): 33 min Charges:  OT General Charges $OT Visit: 1 Procedure OT Evaluation $Initial OT Evaluation Tier I: 1  Procedure OT Treatments $Self Care/Home Management : 23-37 mins G-Codes:    Hortencia Pilar February 21, 2014, 12:43 PM

## 2014-01-23 NOTE — Clinical Social Work Placement (Signed)
Clinical Social Work Department CLINICAL SOCIAL WORK PLACEMENT NOTE 01/23/2014  Patient:  Renee Hernandez, Renee Hernandez  Account Number:  [id number] [place] date:  01/22/2014  Clinical Social Worker:  Charlene Brooke, LCSW  Date/time:  01/23/2014 11:05 AM  Clinical Social Work is seeking post-discharge placement for this patient at the following level of care:   SKILLED NURSING   (*CSW will update this form in Epic as items are completed)   01/23/2014  Patient/family provided with [facility] Department of Clinical Social Work's list of facilities offering this level of care within the geographic area requested by the patient (or if unable, by the patient's family).  01/23/2014  Patient/family informed of their freedom to choose among providers that offer the needed level of care, that participate in Medicare, Medicaid or managed care program needed by the patient, have an available bed and are willing to accept the patient.  01/23/2014  Patient/family informed of [facility]' ownership interest in [facility], as well as of the fact that they are under no obligation to receive care at this facility.  PASARR submitted to EDS on 01/22/2014 PASARR number received on 01/22/2014  FL2 transmitted to all facilities in geographic area requested by pt/family on  01/23/2014 FL2 transmitted to all facilities within larger geographic area on 01/23/2014  Patient informed that his/her managed care company has contracts with or will negotiate with  certain facilities, including the following:     Patient/family informed of bed offers received:  01/23/2014 Patient chooses bed at [facility] Physician recommends and patient chooses bed at    Patient to be transferred to  on   Patient to be transferred to facility by  Patient and family notified of transfer on 01/23/2014 Name of family member notified:  Ernie Hew Gappa/husband  The following physician request were entered in Epic:   Additional  Comments: SW will continue to follow.  Charlene Brooke, MSW Clinical Social Worker [phone]

## 2014-01-23 NOTE — Progress Notes (Signed)
Physical Therapy Treatment Patient Details Name: Renee Hernandez MRN: [id number] DOB: 1933/02/16 Today's Date: 01/23/2014    History of Present Illness pt presents with R TKA.      PT Comments    Patient continues to be limited by drowsiness which is believed to be from pain meds. Able to increase ambulation slowly. Continue to recommend SNF at this time.   Follow Up Recommendations  SNF     Equipment Recommendations  None recommended by PT    Recommendations for Other Services       Precautions / Restrictions Precautions Precautions: Fall Restrictions Weight Bearing Restrictions: Yes RLE Weight Bearing: Weight bearing as tolerated    Mobility  Bed Mobility               General bed mobility comments: not assessed - pt in recliner  Transfers Overall transfer level: Needs assistance Equipment used: Rolling walker (2 wheeled) Transfers: Sit to/from [facility] Sit to Stand: Mod assist;+2 physical assistance Stand pivot transfers: Min assist       General transfer comment: cues for safety due to lethergy, cues for technique. A to power up from recliner  Ambulation/Gait Ambulation/Gait assistance: Min assist;+2 safety/equipment Ambulation Distance (Feet): 45 Feet Assistive device: Rolling walker (2 wheeled) Gait Pattern/deviations: Step-to pattern;Decreased step length - right;Decreased step length - left Gait velocity: decreased   General Gait Details: pt moves slowly and needs max cueing for arousal and attention to task.  cues for safe use of RW, upright posture.     Stairs            Wheelchair Mobility    Modified Rankin (Stroke Patients Only)       Balance Overall balance assessment: Needs assistance Sitting-balance support: Bilateral upper extremity supported;Feet supported Sitting balance-Leahy Scale: Fair     Standing balance support: Bilateral upper extremity supported;During functional activity Standing  balance-Leahy Scale: Poor Standing balance comment: relies on rw for balance                    Cognition Arousal/Alertness: Lethargic;Suspect due to medications Behavior During Therapy: [facility] for tasks assessed/performed Overall Cognitive Status: Within Functional Limits for tasks assessed                      Exercises Total Joint Exercises Quad Sets: AAROM;Right;10 reps Heel Slides: AAROM;Right;10 reps Hip ABduction/ADduction: AAROM;Right;10 reps Straight Leg Raises: AAROM;Right;10 reps    General Comments General comments (skin integrity, edema, etc.): pt very lethergic, needed verbal and tactile cues for arousal      Pertinent Vitals/Pain Pain Assessment: No/denies pain Pain Score: 3  Pain Location: R knee Pain Descriptors / Indicators: Sore Pain Intervention(s): Monitored during session    Home Living Family/patient expects to be discharged to:: Skilled nursing facility Living Arrangements: Spouse/significant other Available Help at Discharge: Family;Available 24 hours/day Type of Home: House Home Access: Stairs to enter Entrance Stairs-Rails: None Home Layout: One level Home Equipment: Environmental consultant - 2 wheels;Bedside commode      Prior Function Level of Independence: Independent with assistive device(s)      Comments: used rw due to knee pain   PT Goals (current goals can now be found in the care plan section) Acute Rehab PT Goals Patient Stated Goal: not stated Progress towards PT goals: Progressing toward goals    Frequency  7X/week    PT Plan Current plan remains appropriate    Co-evaluation  End of Session Equipment Utilized During Treatment: Gait belt Activity Tolerance: Patient tolerated treatment well;Other (comment) (limited by lethargy) Patient left: in chair;with call bell/phone within reach;with family/visitor present     Time: 7530-0511 PT Time Calculation (min): 26 min  Charges:  $Gait Training: 8-22  mins $Therapeutic Exercise: 8-22 mins                    G Codes:      Jacqualyn Posey 01/23/2014, 2:31 PM 01/23/2014 Jacqualyn Posey PTA [phone] pager [phone] office

## 2014-01-23 NOTE — Progress Notes (Signed)
SPORTS MEDICINE AND JOINT REPLACEMENT  Lara Mulch, MD   Carlynn Spry, PA-C [address], [address], [place]  [address]                             [phone]   PROGRESS NOTE  Subjective:  negative for Chest Pain  negative for Shortness of Breath  negative for Nausea/Vomiting   negative for Calf Pain  negative for Bowel Movement   Tolerating Diet: yes         Patient reports pain as 5 on 0-10 scale.    Objective: Vital signs in last 24 hours:   Patient Vitals for the past 24 hrs:  BP Temp Pulse Resp SpO2 Height Weight  01/23/14 0436 125/60 mmHg 97.5 F (36.4 C) 71 16 96 % - -  01/23/14 0400 - - - 16 96 % - -  01/23/14 0115 176/78 mmHg 98.5 F (36.9 C) 79 16 92 % - -  01/22/14 2353 - - - 16 92 % - -  01/22/14 2037 166/78 mmHg 98.2 F (36.8 C) 79 16 92 % - -  01/22/14 2000 - - - 16 92 % - -  01/22/14 1252 144/74 mmHg 97.8 F (36.6 C) 82 16 92 % 5\' 6"  (1.676 m) 103.42 kg (228 lb)  01/22/14 1200 152/74 mmHg 98 F (36.7 C) 81 15 97 % - -  01/22/14 1145 158/67 mmHg - 84 17 96 % - -  01/22/14 1130 149/70 mmHg - 86 16 96 % - -  01/22/14 1115 156/69 mmHg - 82 18 100 % - -  01/22/14 1100 135/66 mmHg 97.9 F (36.6 C) 88 19 97 % - -  01/22/14 1058 - - - - 93 % - -  01/22/14 0835 210/84 mmHg - 81 20 96 % - -  01/22/14 0830 185/71 mmHg - 82 15 98 % - -  01/22/14 0825 195/100 mmHg - 80 15 97 % - -    @flow {1959:LAST@   Intake/Output from previous day:   08/17 0701 - 08/18 0700 In: 1980 [P.O.:480; I.V.:1500] Out: 650 [Drains:550]   Intake/Output this shift:       Intake/Output     08/17 0701 - 08/18 0700 08/18 0701 - 08/19 0700   P.O. 480    I.V. (mL/kg) 1500 (14.5)    Total Intake(mL/kg) 1980 (19.1)    Drains 550    Blood 100    Total Output 650     Net +1330          Urine Occurrence 4 x       LABORATORY DATA:  Recent Labs  01/22/14 1343  WBC 10.2  HGB 13.9  HCT 42.4  PLT 191    Recent Labs  01/22/14 1343  CREATININE 0.59   Lab Results   Component Value Date   INR 1.14 01/12/2014    Examination:  General appearance: alert, cooperative and no distress Extremities: Homans sign is negative, no sign of DVT  Wound Exam: clean, dry, intact   Drainage:  Scant/small amount Serosanguinous exudate  Motor Exam: EHL and FHL Intact  Sensory Exam: Deep Peroneal normal   Assessment:    1 Day Post-Op  Procedure(s) (LRB): TOTAL KNEE ARTHROPLASTY (Right)  ADDITIONAL DIAGNOSIS:  Active Problems:   S/P TKR (total knee replacement) using cement  Acute Blood Loss Anemia   Plan: Physical Therapy as ordered Weight Bearing as Tolerated (WBAT)  DVT Prophylaxis:  Lovenox  DISCHARGE PLAN: Home  DISCHARGE NEEDS: HHPT, CPM, Walker and 3-in-1 comode seat         Renee Hernandez 01/23/2014, 8:23 AM

## 2014-01-23 NOTE — Op Note (Signed)
TOTAL KNEE REPLACEMENT OPERATIVE NOTE:  01/22/2014  1:26 PM  PATIENT:  Renee Hernandez  80 y.o. female  PRE-OPERATIVE DIAGNOSIS:  osteoarthritis right knee  POST-OPERATIVE DIAGNOSIS:  same  PROCEDURE:  Procedure(s): TOTAL KNEE ARTHROPLASTY  SURGEON:  Surgeon(s): Vickey Huger, MD  PHYSICIAN ASSISTANT: Carlynn Spry, [facility]  ANESTHESIA:   general  DRAINS: Hemovac  SPECIMEN: None  COUNTS:  Correct  TOURNIQUET:   Total Tourniquet Time Documented: area (laterality) - 44 minutes Total: area (laterality) - 44 minutes   DICTATION:  Indication for procedure:    The patient is a 80 y.o. female who has failed conservative treatment for osteoarthritis right knee.  Informed consent was obtained prior to anesthesia. The risks versus benefits of the operation were explain and in a way the patient can, and did, understand.   On the implant demand matching protocol, this patient scored 9.  Therefore, this patient was not receive a polyethylene insert with vitamin E which is a high demand implant.  Description of procedure:     The patient was taken to the operating room and placed under anesthesia.  The patient was positioned in the usual fashion taking care that all body parts were adequately padded and/or protected.  I foley catheter was not placed.  A tourniquet was applied and the leg prepped and draped in the usual sterile fashion.  The extremity was exsanguinated with the esmarch and tourniquet inflated to 350 mmHg.  Pre-operative range of motion was normal.  The knee was in 5 degree of mild valgus.  A midline incision approximately 6-7 inches long was made with a #10 blade.  A new blade was used to make a parapatellar arthrotomy going 2-3 cm into the quadriceps tendon, over the patella, and alongside the medial aspect of the patellar tendon.  A synovectomy was then performed with the #10 blade and forceps. I then elevated the deep MCL off the medial tibial metaphysis subperiosteally  around to the semimembranosus attachment.    I everted the patella and used calipers to measure patellar thickness.  I used the reamer to ream down to appropriate thickness to recreate the native thickness.  I then removed excess bone with the rongeur and sagittal saw.  I used the appropriately sized template and drilled the three lug holes.  I then put the trial in place and measured the thickness with the calipers to ensure recreation of the native thickness.  The trial was then removed and the patella subluxed and the knee brought into flexion.  A homan retractor was place to retract and protect the patella and lateral structures.  A Z-retractor was place medially to protect the medial structures.  The extra-medullary alignment system was used to make cut the tibial articular surface perpendicular to the anamotic axis of the tibia and in 3 degrees of posterior slope.  The cut surface and alignment jig was removed.  I then used the intramedullary alignment guide to make a 4 valgus cut on the distal femur.  I then marked out the epicondylar axis on the distal femur.  The posterior condylar axis measured 5 degrees.  I then used the anterior referencing sizer and measured the femur to be a size 6.  The 4-In-1 cutting block was screwed into place in external rotation matching the posterior condylar angle, making our cuts perpendicular to the epicondylar axis.  Anterior, posterior and chamfer cuts were made with the sagittal saw.  The cutting block and cut pieces were removed.  A lamina spreader  was placed in 90 degrees of flexion.  The ACL, PCL, menisci, and posterior condylar osteophytes were removed.  A 13 mm spacer blocked was found to offer good flexion and extension gap balance after minimal in degree releasing.   The scoop retractor was then placed and the femoral finishing block was pinned in place.  The small sagittal saw was used as well as the lug drill to finish the femur.  The block and cut  surfaces were removed and the medullary canal hole filled with autograft bone from the cut pieces.  The tibia was delivered forward in deep flexion and external rotation.  A size D tray was selected and pinned into place centered on the medial 1/3 of the tibial tubercle.  The reamer and keel was used to prepare the tibia through the tray.    I then trialed with the size 6 femur, size D tibia, a 13 mm insert and the 32 patella.  I had excellent flexion/extension gap balance, excellent patella tracking.  Flexion was full and beyond 120 degrees; extension was zero.  These components were chosen and the staff opened them to me on the back table while the knee was lavaged copiously and the cement mixed.  The soft tissue was infiltrated with 60cc of exparel 1.3% through a 21 gauge needle.  I cemented in the components and removed all excess cement.  The polyethylene tibial component was snapped into place and the knee placed in extension while cement was hardening.  The capsule was infilltrated with 30cc of .25% Marcaine with epinephrine.  A hemovac was place in the joint exiting superolaterally.  A pain pump was place superomedially superficial to the arthrotomy.  Once the cement was hard, the tourniquet was let down.  Hemostasis was obtained.  The arthrotomy was closed with figure-8 #1 vicryl sutures.  The deep soft tissues were closed with #0 vicryls and the subcuticular layer closed with a running #2-0 vicryl.  The skin was reapproximated and closed with skin staples.  The wound was dressed with xeroform, 4 x4's, 2 ABD sponges, a single layer of webril and a TED stocking.   The patient was then awakened, extubated, and taken to the recovery room in stable condition.  BLOOD LOSS:  300cc DRAINS: 1 hemovac, 1 pain catheter COMPLICATIONS:  None.  PLAN OF CARE: Admit to inpatient   PATIENT DISPOSITION:  PACU - hemodynamically stable.   Delay start of Pharmacological VTE agent (>24hrs) due to surgical  blood loss or risk of bleeding:  not applicable  Please fax a copy of this op note to my office at [phone] (please only include page 1 and 2 of the Case Information op note)

## 2014-01-23 NOTE — Progress Notes (Signed)
Orthopedic Tech Progress Note Patient Details:  Renee Hernandez 1932/06/15 [id number] On cpm at 7:40 pm RLE 0-90 Patient ID: Renee Hernandez, female   DOB: 12/24/1932, 80 y.o.   MRN: [id number]   [name] 01/23/2014, 7:41 PM

## 2014-01-24 ENCOUNTER — Encounter (HOSPITAL_COMMUNITY): Payer: Self-pay | Admitting: Orthopedic Surgery

## 2014-01-24 MED ORDER — METHOCARBAMOL 500 MG PO TABS: 500.0000 mg | ORAL_TABLET | Freq: Four times a day (QID) | ORAL | Status: DC | PRN

## 2014-01-24 MED ORDER — ENOXAPARIN SODIUM 40 MG/0.4ML ~~LOC~~ SOLN: 40.0000 mg | SUBCUTANEOUS | Status: AC

## 2014-01-24 MED ORDER — HYDROCODONE-ACETAMINOPHEN 5-325 MG PO TABS: 1.0000 | ORAL_TABLET | ORAL | Status: DC | PRN

## 2014-01-24 NOTE — Discharge Summary (Signed)
SPORTS MEDICINE & JOINT REPLACEMENT   Lara Mulch, MD   Carlynn Spry, PA-C [place], [place], [place]  [address]                             [phone]  PATIENT ID: Renee Hernandez        MRN:  [id number]          DOB/AGE: Nov 01, 1932 / 80 y.o.    DISCHARGE SUMMARY  ADMISSION DATE:    01/22/2014 DISCHARGE DATE:  01/25/2014  ADMISSION DIAGNOSIS: osteoarthritis right knee    DISCHARGE DIAGNOSIS:  osteoarthritis right knee    ADDITIONAL DIAGNOSIS: Active Problems:   S/P TKR (total knee replacement) using cement  Past Medical History  Diagnosis Date  . Depression   . Arthritis   . Hypertension   . Cancer   . Bladder cancer     PROCEDURE: Procedure(s): TOTAL KNEE ARTHROPLASTY on 01/22/2014  CONSULTS:     HISTORY:  See H&P in chart  HOSPITAL COURSE:  Renee Hernandez is a 80 y.o. admitted on 01/22/2014 and found to have a diagnosis of osteoarthritis right knee.  After appropriate laboratory studies were obtained  they were taken to the operating room on 01/22/2014 and underwent Procedure(s): TOTAL KNEE ARTHROPLASTY.   They were given perioperative antibiotics:  Anti-infectives   Start     Dose/Rate Route Frequency Ordered Stop   01/22/14 2000  vancomycin (VANCOCIN) IVPB 1000 mg/200 mL premix     1,000 mg 200 mL/hr over 60 Minutes Intravenous Every 12 hours 01/22/14 1231 01/22/14 2110   01/22/14 0600  vancomycin (VANCOCIN) 1,500 mg in sodium chloride 0.9 % 500 mL IVPB     1,500 mg 250 mL/hr over 120 Minutes Intravenous On call to O.R. 01/21/14 1346 01/22/14 0900    .  Tolerated the procedure well.  Placed with a foley intraoperatively.  Given Ofirmev at induction and for 48 hours.    POD# 1: Vital signs were stable.  Patient denied Chest pain, shortness of breath, or calf pain.  Patient was started on Lovenox 30 mg subcutaneously twice daily at 8am.  Consults to PT, OT, and care management were made.  The patient was weight bearing as tolerated.  CPM was placed  on the operative leg 0-90 degrees for 6-8 hours a day.  Incentive spirometry was taught.  Dressing was changed.  Hemovac was discontinued.      POD #2, Continued  PT for ambulation and exercise program.  IV saline locked.  O2 discontinued.    The remainder of the hospital course was dedicated to ambulation and strengthening.   The patient was discharged on 3 days post op in  Good condition.  Blood products given:none  DIAGNOSTIC STUDIES: Recent vital signs: Patient Vitals for the past 24 hrs:  BP Temp Temp src Pulse Resp SpO2 Weight  01/24/14 1605 123/56 mmHg 97.8 F (36.6 C) Oral 82 16 96 % -  01/24/14 0830 112/60 mmHg 99.1 F (37.3 C) Oral 89 13 95 % 107.8 kg (237 lb 10.5 oz)  01/24/14 0420 155/78 mmHg 99.3 F (37.4 C) Oral 82 15 93 % -  01/24/14 0400 - - - - 15 93 % -  01/24/14 0000 - - - - 15 93 % -  01/23/14 2102 135/69 mmHg 99.8 F (37.7 C) Oral 81 14 94 % -  01/23/14 2000 - - - - 14 94 % -  Recent laboratory studies:  Recent Labs  01/22/14 1343  WBC 10.2  HGB 13.9  HCT 42.4  PLT 191    Recent Labs  01/22/14 1343  CREATININE 0.59   Lab Results  Component Value Date   INR 1.14 01/12/2014     Recent Radiographic Studies :  Dg Chest 2 View  01/12/2014   CLINICAL DATA:  Total knee arthroplasty, hypertension, nonsmoker.  EXAM: CHEST  2 VIEW  COMPARISON:  None.  FINDINGS: Aortic tortuosity, dilatation, and advanced atherosclerosis. Enlarged cardiac silhouette. No confluent airspace opacity, pleural effusion, or pneumothorax. Osteopenia and mild multilevel degenerative changes.  IMPRESSION: Prominent cardiomediastinal contours. Aortic atherosclerosis, tortuosity, and dilatation.  No focal consolidation.   Electronically Signed   By: Carlos Levering M.D.   On: 01/12/2014 13:50    DISCHARGE INSTRUCTIONS: Discharge Instructions   CPM    Complete by:  As directed   Continuous passive motion machine (CPM):      Use the CPM from 0 to 90 for 6-8 hours per day.       You may increase by 10 per day.  You may break it up into 2 or 3 sessions per day.      Use CPM for 2 weeks or until you are told to stop.     Call MD / Call 911    Complete by:  As directed   If you experience chest pain or shortness of breath, CALL 911 and be transported to the hospital emergency room.  If you develope a fever above 101 F, pus (white drainage) or increased drainage or redness at the wound, or calf pain, call your surgeon's office.     Change dressing    Complete by:  As directed   Change dressing on Friday, then change the dressing daily with sterile 4 x 4 inch gauze dressing and apply TED hose.     Constipation Prevention    Complete by:  As directed   Drink plenty of fluids.  Prune juice may be helpful.  You may use a stool softener, such as Colace (over the counter) 100 mg twice a day.  Use MiraLax (over the counter) for constipation as needed.     Diet - low sodium heart healthy    Complete by:  As directed      Do not put a pillow under the knee. Place it under the heel.    Complete by:  As directed      Driving restrictions    Complete by:  As directed   No driving for 6 weeks     Increase activity slowly as tolerated    Complete by:  As directed      Lifting restrictions    Complete by:  As directed   No lifting for 6 weeks     TED hose    Complete by:  As directed   Use stockings (TED hose) for 3 weeks on both leg(s).  You may remove them at night for sleeping.           DISCHARGE MEDICATIONS:     Medication List         enoxaparin 40 MG/0.4ML injection  Commonly known as:  LOVENOX  Inject 0.4 mLs (40 mg total) into the skin daily.     FLUoxetine 20 MG capsule  Commonly known as:  PROZAC  Take 20 mg by mouth daily.     furosemide 20 MG tablet  Commonly known as:  LASIX  Take  20 mg by mouth daily.     HYDROcodone-acetaminophen 5-325 MG per tablet  Commonly known as:  NORCO/VICODIN  Take 1-2 tablets by mouth every 4 (four) hours as needed  for moderate pain.     losartan-hydrochlorothiazide 100-25 MG per tablet  Commonly known as:  HYZAAR  Take 1 tablet by mouth daily.     methocarbamol 500 MG tablet  Commonly known as:  ROBAXIN  Take 1 tablet (500 mg total) by mouth every 6 (six) hours as needed for muscle spasms.        FOLLOW UP VISIT:       Follow-up Information   Follow up with Rudean Haskell, MD. Call on 02/06/2014.   Specialty:  Orthopedic Surgery   Contact information:   [place] [address] [phone]       DISPOSITION: SNF Stop Lovenox 02/05/14  CONDITION:  Good   Renee Hernandez 01/24/2014, 4:57 PM

## 2014-01-24 NOTE — Progress Notes (Signed)
Physical Therapy Treatment Patient Details Name: Renee Hernandez MRN: [id number] DOB: Nov 01, 1932 Today's Date: 01/24/2014    History of Present Illness pt presents with R TKA.      PT Comments    Patient very much still limited by lethargy. Requires constant cues to stay focused on task at hand and to participate. Much of her therex turned into PROM as she was falling asleep. Patient is planning to DC to SNF in [place] later today  Follow Up Recommendations  SNF     Equipment Recommendations  None recommended by PT    Recommendations for Other Services       Precautions / Restrictions Precautions Precautions: Fall Restrictions Weight Bearing Restrictions: Yes RLE Weight Bearing: Weight bearing as tolerated    Mobility  Bed Mobility   Bed Mobility: Supine to Sit     Supine to sit: Min assist     General bed mobility comments: A for truncal support. cues for positioning of LE  Transfers Overall transfer level: Needs assistance Equipment used: Rolling walker (2 wheeled) Transfers: Sit to/from Stand Sit to Stand: Mod assist;+2 physical assistance         General transfer comment: Cues for hand placement and safety. A to power up from lower surface. A to control descent onto recliner.   Ambulation/Gait Ambulation/Gait assistance: Min assist;+2 safety/equipment Ambulation Distance (Feet): 80 Feet Assistive device: Rolling walker (2 wheeled) Gait Pattern/deviations: Step-to pattern;Decreased step length - left;Antalgic Gait velocity: decreased   General Gait Details: pt moves slowly and needs max cueing for arousal and attention to task.  cues for safe use of RW and positioning of RW. Patient with decreased L step length due to limited weight shift to right for weight bearing   Stairs            Wheelchair Mobility    Modified Rankin (Stroke Patients Only)       Balance                                    Cognition  Arousal/Alertness: Lethargic;Suspect due to medications Behavior During Therapy: [facility] for tasks assessed/performed Overall Cognitive Status: Within Functional Limits for tasks assessed                      Exercises Total Joint Exercises Quad Sets: AAROM;Right;10 reps Heel Slides: AAROM;Right;10 reps Hip ABduction/ADduction: AAROM;Right;10 reps Straight Leg Raises: AAROM;Right;10 reps Long Arc Quad: AAROM;Right;10 reps    General Comments        Pertinent Vitals/Pain Pain Score: 3  Pain Location: R knee Pain Descriptors / Indicators: Sore Pain Intervention(s): Monitored during session;Limited activity within patient's tolerance    Home Living                      Prior Function            PT Goals (current goals can now be found in the care plan section) Progress towards PT goals: Progressing toward goals    Frequency  7X/week    PT Plan Current plan remains appropriate    Co-evaluation             End of Session Equipment Utilized During Treatment: Gait belt Activity Tolerance: Patient limited by fatigue;Other (comment);Patient tolerated treatment well (Patient still lethargic)       Time: 8756-4332 PT Time Calculation (min): 29 min  Charges:  $Gait  Training: 8-22 mins $Therapeutic Exercise: 8-22 mins                    G Codes:      Jacqualyn Posey 01/24/2014, 11:48 AM 01/24/2014 Jacqualyn Posey PTA [phone] pager [phone] office

## 2014-01-24 NOTE — Progress Notes (Signed)
MD on call notified that pt had a mental status change A&O x2 she knew her name and date but confused about location she thought she was at home. She refused her lovenox injection and asked for her husband. Husband was called and vital signs WNL with exception of O2 stats between 88-91% on room air. Pt refused to allow me to place O2.Husband arrived and I was able to place oxygen 2l [place]. Husband stated that he will stay tonight. Bed alarm on will continue to monitor. Arthor Captain LPN

## 2014-01-24 NOTE — Discharge Instructions (Signed)
Diet: As you were doing prior to hospitalization   Activity:  Increase activity slowly as tolerated                  No lifting or driving for 6 weeks  Shower:  May shower without a dressing once there is no drainage from your wound.                 Do NOT wash over the wound.                 Dressing:  You may change your dressing on Friday                    Then change the dressing daily with sterile 4"x4"s gauze dressing                     And TED hose for knees.  Weight Bearing:  Weight bearing as tolerated as taught in physical therapy.  Use a                                walker or Crutches as instructed.  To prevent constipation: you may use a stool softener such as -               Colace ( over the counter) 100 mg by mouth twice a day                Drink plenty of fluids ( prune juice may be helpful) and high fiber foods                Miralax ( over the counter) for constipation as needed.    Precautions:  If you experience chest pain or shortness of breath - call 911 immediately               For transfer to the hospital emergency department!!               If you develop a fever greater that 101 F, purulent drainage from wound,                             increased redness or drainage from wound, or calf pain -- Call the office.  Follow- Up Appointment:  Please call for an appointment to be seen on 02/06/14                                              [facility] office:  [phone]            [address] [place], [place] [address]

## 2014-01-24 NOTE — Progress Notes (Signed)
SPORTS MEDICINE AND JOINT REPLACEMENT  Lara Mulch, MD   Carlynn Spry, PA-C [place], [place], [place]  [address]                             [phone]   PROGRESS NOTE  Subjective:  negative for Chest Pain  negative for Shortness of Breath  negative for Nausea/Vomiting   negative for Calf Pain  negative for Bowel Movement   Tolerating Diet: yes         Patient reports pain as 5 on 0-10 scale.    Objective: Vital signs in last 24 hours:   Patient Vitals for the past 24 hrs:  BP Temp Temp src Pulse Resp SpO2 Weight  01/24/14 1605 123/56 mmHg 97.8 F (36.6 C) Oral 82 16 96 % -  01/24/14 0830 112/60 mmHg 99.1 F (37.3 C) Oral 89 13 95 % 107.8 kg (237 lb 10.5 oz)  01/24/14 0420 155/78 mmHg 99.3 F (37.4 C) Oral 82 15 93 % -  01/24/14 0400 - - - - 15 93 % -  01/24/14 0000 - - - - 15 93 % -  01/23/14 2102 135/69 mmHg 99.8 F (37.7 C) Oral 81 14 94 % -  01/23/14 2000 - - - - 14 94 % -    @flow {1959:LAST@   Intake/Output from previous day:   08/18 0701 - 08/19 0700 In: 883.8 [P.O.:480; I.V.:403.8] Out: -    Intake/Output this shift:   08/19 0701 - 08/19 1900 In: 480 [P.O.:480] Out: 575 [Urine:575]   Intake/Output     08/18 0701 - 08/19 0700 08/19 0701 - 08/20 0700   P.O. 480 480   I.V. (mL/kg) 403.8 (3.9)    Total Intake(mL/kg) 883.8 (8.5) 480 (4.5)   Urine (mL/kg/hr)  575 (0.5)   Drains     Blood     Total Output   575   Net +883.8 -95        Urine Occurrence 1 x 4 x      LABORATORY DATA:  Recent Labs  01/22/14 1343  WBC 10.2  HGB 13.9  HCT 42.4  PLT 191    Recent Labs  01/22/14 1343  CREATININE 0.59   Lab Results  Component Value Date   INR 1.14 01/12/2014    Examination:  General appearance: alert, cooperative and no distress Extremities: Homans sign is negative, no sign of DVT  Wound Exam: clean, dry, intact   Drainage:  None: wound tissue dry  Motor Exam: EHL and FHL Intact  Sensory Exam: Deep Peroneal  normal   Assessment:    2 Days Post-Op  Procedure(s) (LRB): TOTAL KNEE ARTHROPLASTY (Right)  ADDITIONAL DIAGNOSIS:  Active Problems:   S/P TKR (total knee replacement) using cement  Acute Blood Loss Anemia   Plan: Physical Therapy as ordered Weight Bearing as Tolerated (WBAT)  DVT Prophylaxis:  Lovenox  DISCHARGE PLAN: Skilled Nursing Facility/Rehab  DISCHARGE NEEDS: HHPT, CPM, Walker and 3-in-1 comode seat         Diahn Waidelich 01/24/2014, 4:51 PM

## 2014-01-25 NOTE — Care Management Note (Signed)
CARE MANAGEMENT NOTE 01/25/2014  Patient:  Renee Hernandez, Renee Hernandez   Account Number:  [id number]  Date Initiated:  01/22/2014  Documentation initiated by:  Ricki Miller  Subjective/Objective Assessment:   80 yr old female s/p right total knee arthroplasty.     Action/Plan:   PT/OT eval  Patient preoperatively setup with [facility], may need SNF.  Patient's husband states they want [facility] is aware.   Anticipated DC Date:  01/25/2014   Anticipated DC Plan:  SKILLED NURSING FACILITY  In-house referral  Clinical Social Worker      DC Planning Services  CM consult      Choice offered to / List presented to:             [place]   Status of service:  In process, will continue to follow Medicare Important Message given?  YES (If response is "NO", the following Medicare IM given date fields will be blank) Date Medicare IM given:  01/25/2014 Medicare IM given by:  Ricki Miller Date Additional Medicare IM given:   Additional Medicare IM given by:    Discharge Disposition:  [place]  Per UR Regulation:  Reviewed for med. necessity/level of care/duration of stay

## 2014-01-25 NOTE — Progress Notes (Signed)
Physical Therapy Treatment Patient Details Name: Renee Hernandez MRN: [id number] DOB: [birth year] Today's Date: 01/25/2014    History of Present Illness pt presents with R TKA.      PT Comments    Patient much more alert this session and progressing well today. She was able to participate much more with therex. Planning for DC today to SNF  Follow Up Recommendations        Equipment Recommendations  None recommended by PT    Recommendations for Other Services       Precautions / Restrictions Precautions Precautions: Fall Restrictions Weight Bearing Restrictions: Yes RLE Weight Bearing: Weight bearing as tolerated    Mobility  Bed Mobility               General bed mobility comments: Patient up in recliner before and after session  Transfers Overall transfer level: Needs assistance Equipment used: Rolling walker (2 wheeled)   Sit to Stand: Min assist         General transfer comment: Cues for hand placement and safety.  Ambulation/Gait Ambulation/Gait assistance: Min guard Ambulation Distance (Feet): 80 Feet Assistive device: Rolling walker (2 wheeled) Gait Pattern/deviations: Step-through pattern;Decreased stride length Gait velocity: decreased   General Gait Details: Patient continues to move slower but much more alert and moving more smoothly   Stairs            Wheelchair Mobility    Modified Rankin (Stroke Patients Only)       Balance                                    Cognition Arousal/Alertness: Awake/alert Behavior During Therapy: WFL for tasks assessed/performed Overall Cognitive Status: Within Functional Limits for tasks assessed                      Exercises Total Joint Exercises Quad Sets: Right;10 reps;AROM Heel Slides: AAROM;Right;10 reps Hip ABduction/ADduction: AAROM;Right;10 reps Straight Leg Raises: AAROM;Right;10 reps Long Arc Quad: AAROM;Right;10 reps    General Comments         Pertinent Vitals/Pain Pain Assessment: No/denies pain    Home Living                      Prior Function            PT Goals (current goals can now be found in the care plan section) Progress towards PT goals: Progressing toward goals    Frequency  7X/week    PT Plan Current plan remains appropriate    Co-evaluation             End of Session Equipment Utilized During Treatment: Gait belt Activity Tolerance: Patient tolerated treatment well Patient left: in chair;with call bell/phone within reach     Time: 0950-1015 PT Time Calculation (min): 25 min  Charges:  $Gait Training: 8-22 mins $Therapeutic Exercise: 8-22 mins                    G Codes:      Jacqualyn Posey 01/25/2014, 11:56 AM 01/25/2014 Jacqualyn Posey PTA [phone] pager [phone] office

## 2014-01-25 NOTE — Discharge Summary (Signed)
SPORTS MEDICINE & JOINT REPLACEMENT   Renee Mulch, MD   Renee Spry, PA-C [place], [place],   [address]                             [phone]  PATIENT ID: Renee Hernandez        MRN:  [id number]          DOB/AGE: Jan 24, 1933 / 80 y.o.    DISCHARGE SUMMARY  ADMISSION DATE:    01/22/2014 DISCHARGE DATE:   01/25/2014   ADMISSION DIAGNOSIS: osteoarthritis right knee    DISCHARGE DIAGNOSIS:  osteoarthritis right knee    ADDITIONAL DIAGNOSIS: Active Problems:   S/P TKR (total knee replacement) using cement  Past Medical History  Diagnosis Date  . Depression   . Arthritis   . Hypertension   . Cancer   . Bladder cancer     PROCEDURE: Procedure(s): TOTAL KNEE ARTHROPLASTY on 01/22/2014  CONSULTS:     HISTORY:  See H&P in chart  HOSPITAL COURSE:  Renee Hernandez is a 80 y.o. admitted on 01/22/2014 and found to have a diagnosis of osteoarthritis right knee.  After appropriate laboratory studies were obtained  they were taken to the operating room on 01/22/2014 and underwent Procedure(s): TOTAL KNEE ARTHROPLASTY.   They were given perioperative antibiotics:  Anti-infectives   Start     Dose/Rate Route Frequency Ordered Stop   01/22/14 2000  vancomycin (VANCOCIN) IVPB 1000 mg/200 mL premix     1,000 mg 200 mL/hr over 60 Minutes Intravenous Every 12 hours 01/22/14 1231 01/22/14 2110   01/22/14 0600  vancomycin (VANCOCIN) 1,500 mg in sodium chloride 0.9 % 500 mL IVPB     1,500 mg 250 mL/hr over 120 Minutes Intravenous On Hernandez to O.R. 01/21/14 1346 01/22/14 0900    .  Tolerated the procedure well.  Placed with a foley intraoperatively.  Given Ofirmev at induction and for 48 hours.    POD# 1: Vital signs were stable.  Patient denied Chest pain, shortness of breath, or calf pain.  Patient was started on Lovenox 30 mg subcutaneously twice daily at 8am.  Consults to PT, OT, and care management were made.  The patient was weight bearing as tolerated.  CPM was  placed on the operative leg 0-90 degrees for 6-8 hours a day.  Incentive spirometry was taught.  Dressing was changed.  Hemovac was discontinued.      POD #2 and 3, Continued  PT for ambulation and exercise program.  IV saline locked.  O2 discontinued.    The remainder of the hospital course was dedicated to ambulation and strengthening.   The patient was discharged on 3 Days Post-Op in  Good condition.  Blood products given:none  DIAGNOSTIC STUDIES: Recent vital signs: Patient Vitals for the past 24 hrs:  BP Temp Temp src Pulse Resp SpO2  01/25/14 0500 158/88 mmHg 98.4 F (36.9 C) Oral 82 20 92 %  01/24/14 2037 136/74 mmHg 97.7 F (36.5 C) Oral 86 20 91 %  01/24/14 1605 123/56 mmHg 97.8 F (36.6 C) Oral 82 16 96 %       Recent laboratory studies:  Recent Labs  01/22/14 1343  WBC 10.2  HGB 13.9  HCT 42.4  PLT 191    Recent Labs  01/22/14 1343  CREATININE 0.59   Lab Results  Component Value Date   INR 1.14 01/12/2014     Recent Radiographic Studies :  Dg Chest 2 View  01/12/2014   CLINICAL DATA:  Total knee arthroplasty, hypertension, nonsmoker.  EXAM: CHEST  2 VIEW  COMPARISON:  None.  FINDINGS: Aortic tortuosity, dilatation, and advanced atherosclerosis. Enlarged cardiac silhouette. No confluent airspace opacity, pleural effusion, or pneumothorax. Osteopenia and mild multilevel degenerative changes.  IMPRESSION: Prominent cardiomediastinal contours. Aortic atherosclerosis, tortuosity, and dilatation.  No focal consolidation.   Electronically Signed   By: Renee Hernandez M.D.   On: 01/12/2014 13:50    DISCHARGE INSTRUCTIONS: Discharge Instructions   CPM    Complete by:  As directed   Continuous passive motion machine (CPM):      Use the CPM from 0 to 90 for 6-8 hours per day.      You may increase by 10 per day.  You may break it up into 2 or 3 sessions per day.      Use CPM for 2 weeks or until you are told to stop.     Call MD / Hernandez 911    Complete by:  As  directed   If you experience chest pain or shortness of breath, Hernandez 911 and be transported to the hospital emergency room.  If you develope a fever above 101 F, pus (white drainage) or increased drainage or redness at the wound, or calf pain, Hernandez your surgeon's office.     Change dressing    Complete by:  As directed   Change dressing on Friday, then change the dressing daily with sterile 4 x 4 inch gauze dressing and apply TED hose.     Constipation Prevention    Complete by:  As directed   Drink plenty of fluids.  Prune juice may be helpful.  You may use a stool softener, such as Colace (over the counter) 100 mg twice a day.  Use MiraLax (over the counter) for constipation as needed.     Diet - low sodium heart healthy    Complete by:  As directed      Do not put a pillow under the knee. Place it under the heel.    Complete by:  As directed      Driving restrictions    Complete by:  As directed   No driving for 6 weeks     Increase activity slowly as tolerated    Complete by:  As directed      Lifting restrictions    Complete by:  As directed   No lifting for 6 weeks     TED hose    Complete by:  As directed   Use stockings (TED hose) for 3 weeks on both leg(s).  You may remove them at night for sleeping.           DISCHARGE MEDICATIONS:     Medication List         enoxaparin 40 MG/0.4ML injection  Commonly known as:  LOVENOX  Inject 0.4 mLs (40 mg total) into the skin daily.     FLUoxetine 20 MG capsule  Commonly known as:  PROZAC  Take 20 mg by mouth daily.     furosemide 20 MG tablet  Commonly known as:  LASIX  Take 20 mg by mouth daily.     HYDROcodone-acetaminophen 5-325 MG per tablet  Commonly known as:  NORCO/VICODIN  Take 1-2 tablets by mouth every 4 (four) hours as needed for moderate pain.     losartan-hydrochlorothiazide 100-25 MG per tablet  Commonly known as:  HYZAAR  Take 1 tablet  by mouth daily.     methocarbamol 500 MG tablet  Commonly known  as:  ROBAXIN  Take 1 tablet (500 mg total) by mouth every 6 (six) hours as needed for muscle spasms.        FOLLOW UP VISIT:       Follow-up Information   Follow up with Renee Haskell, MD. Hernandez on 02/06/2014.   Specialty:  Orthopedic Surgery   Contact information:   [place] [address] [phone]       DISPOSITION: SNF [facility]  CONDITION:  Good   Ivon Oelkers 01/25/2014, 11:20 AM

## 2014-01-25 NOTE — Progress Notes (Signed)
Patient for discharge to SNF bed at [facility] today. Plans confirmed with patient and family who are in agreement. SNF is prepared for patient. SW offered to transport pt via [facility] pt/pt.'s husband declined and opted to transport via personal vehicle.   Charlene Brooke, MSW Clinical Social Worker [phone]           .

## 2015-01-14 IMAGING — CR DG CHEST 2V
2 series · 2 of 2 positions shown · non-contrast
Comparison: None.

CLINICAL DATA: Total knee arthroplasty, hypertension, nonsmoker.

EXAM:
CHEST  2 VIEW

[w chest pa]
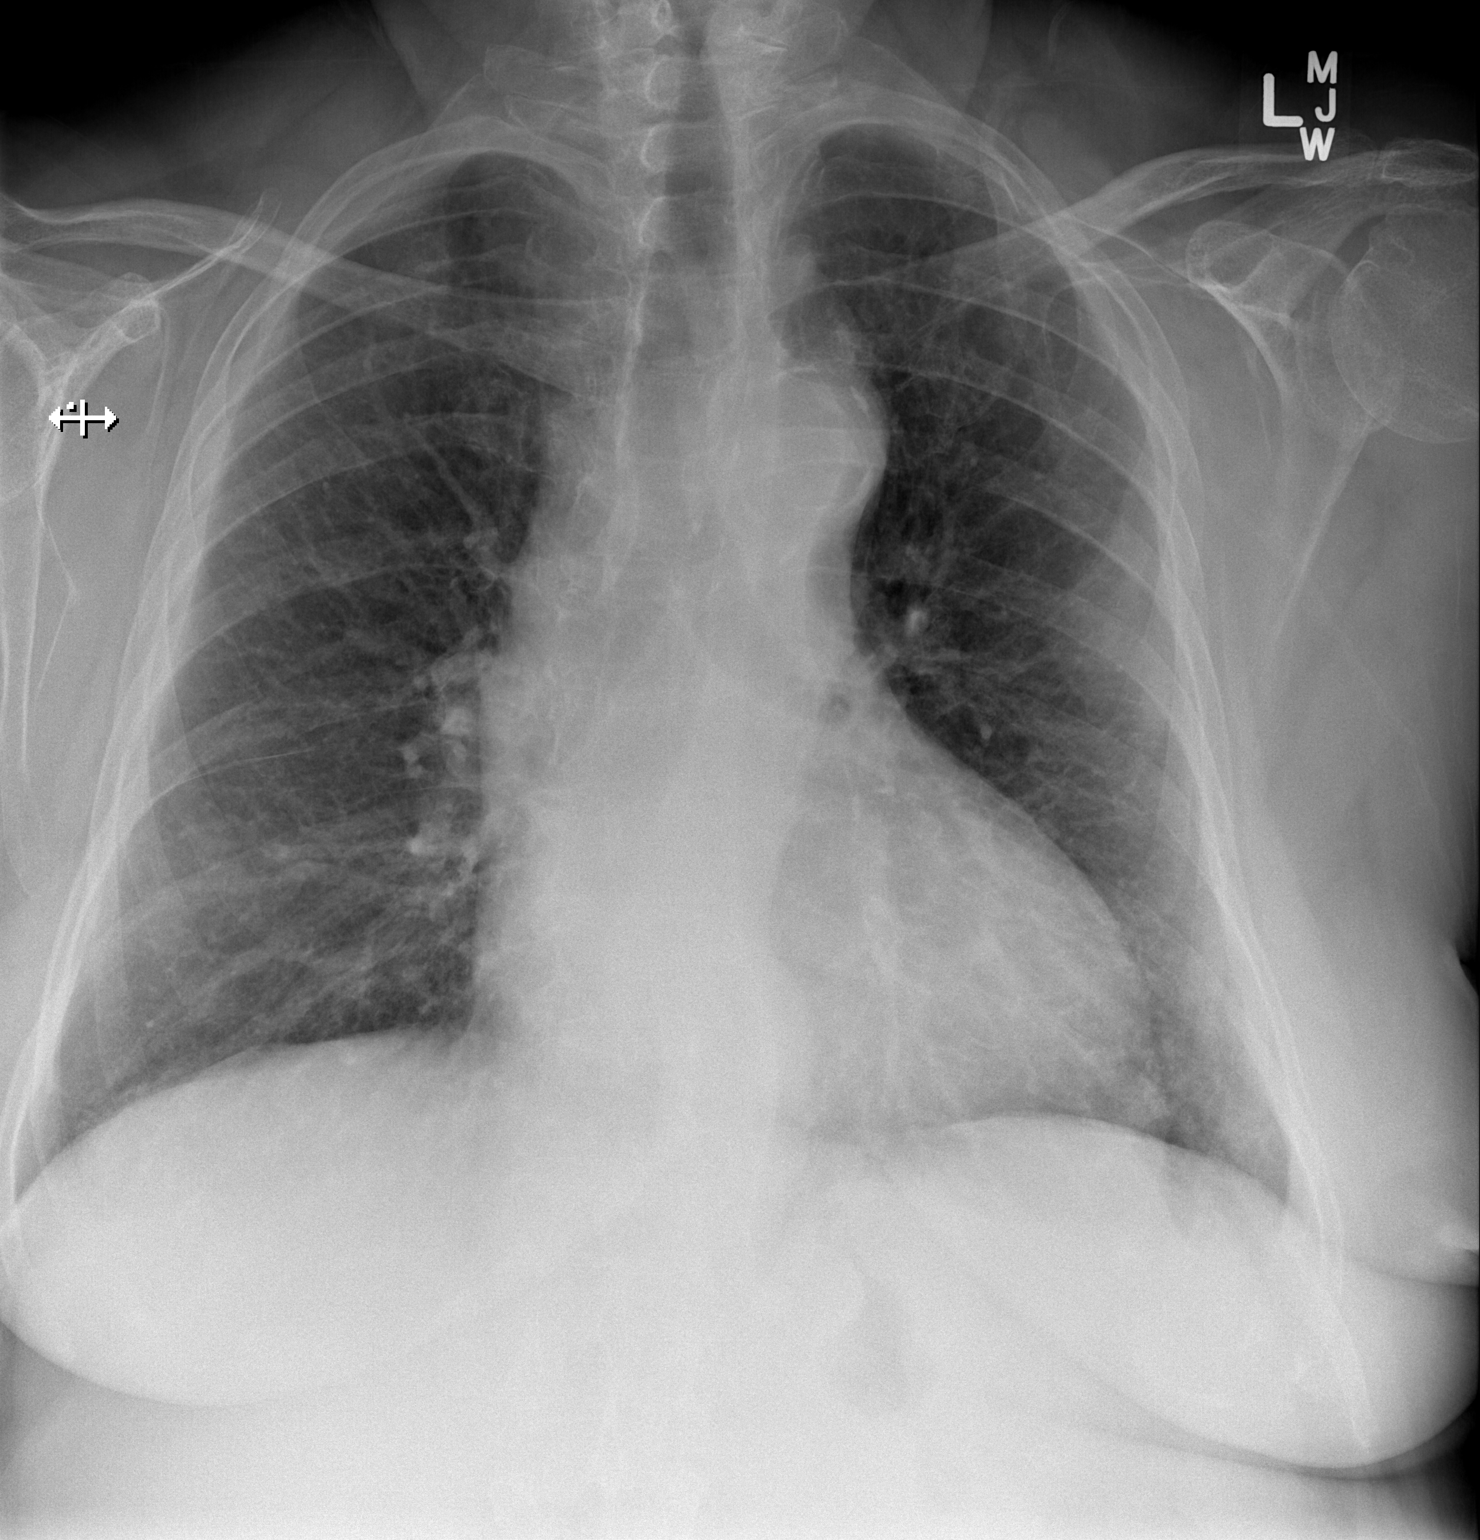

[w chest lat]
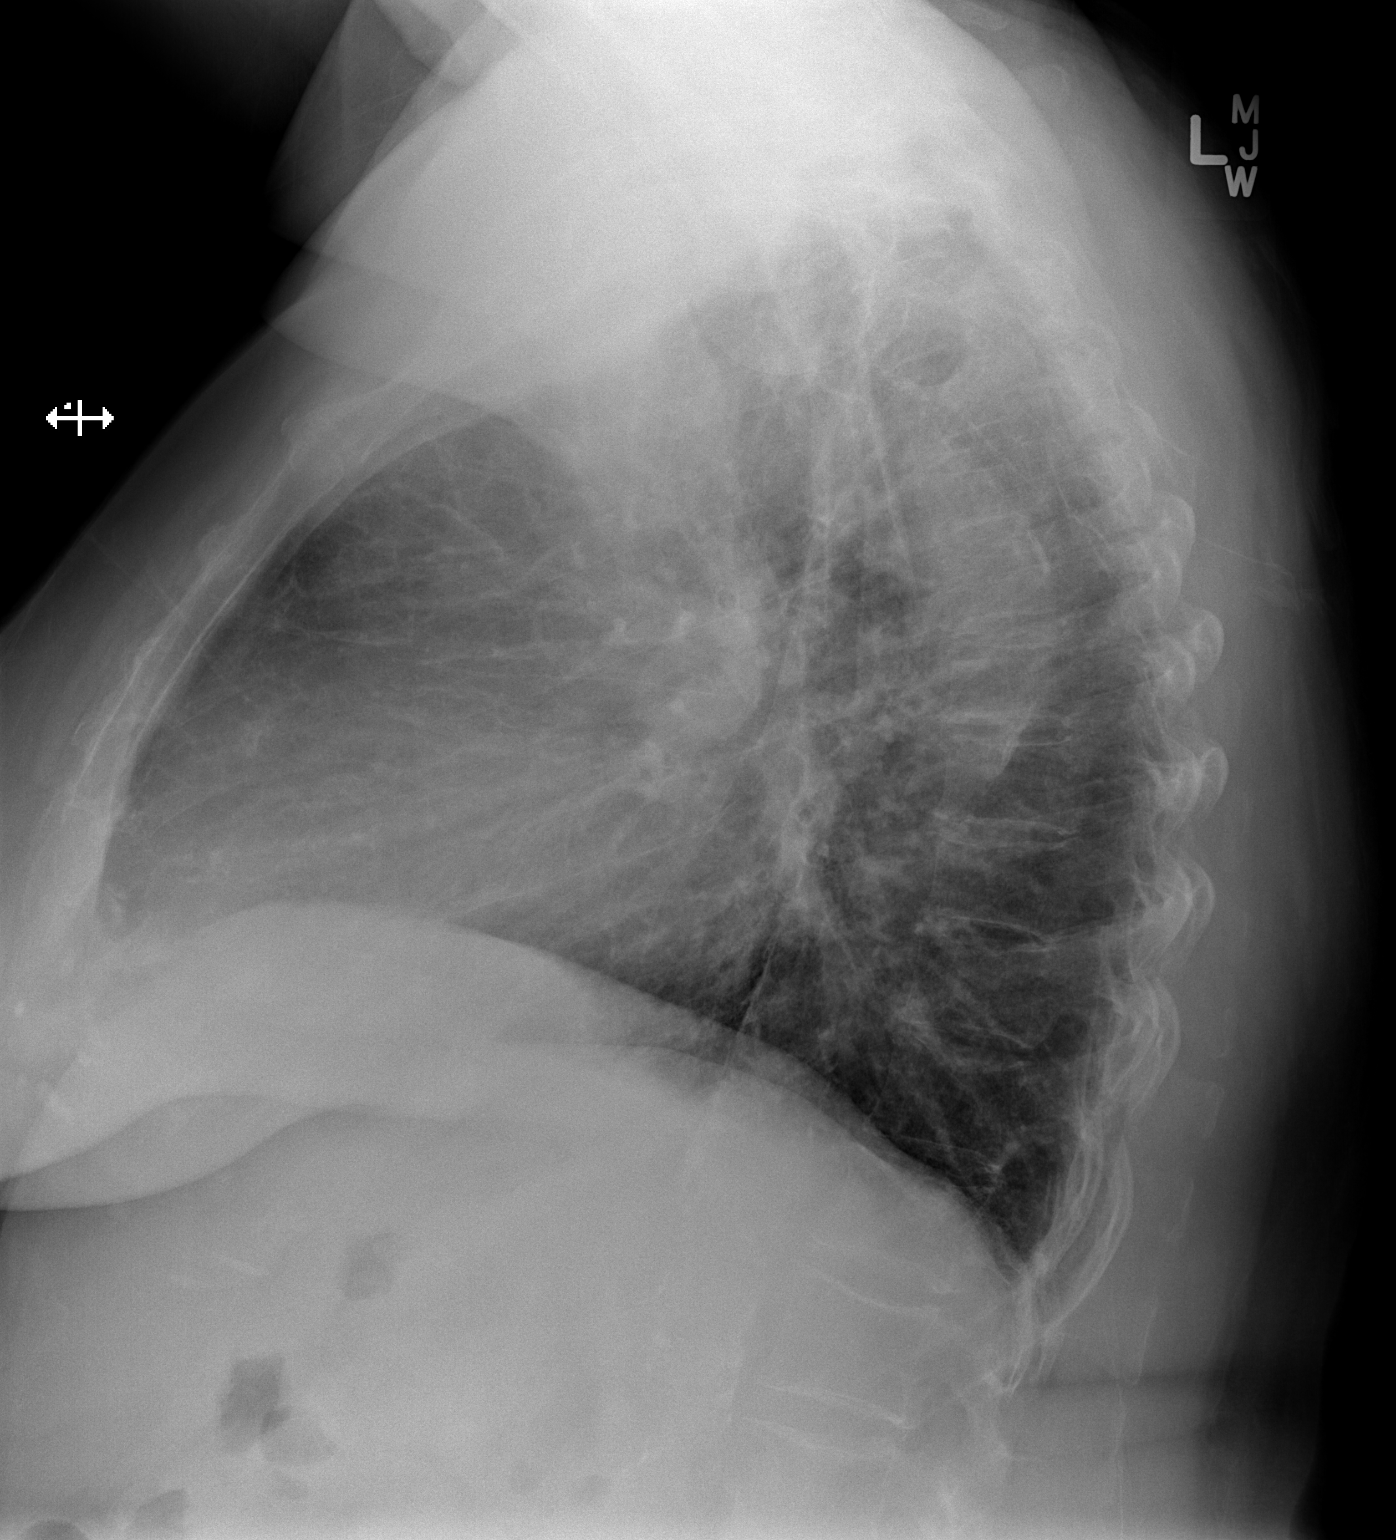

[2 of 2 positions shown; findings below may reference images not displayed]

FINDINGS: Aortic tortuosity, dilatation, and advanced atherosclerosis.
Enlarged cardiac silhouette. No confluent airspace opacity, pleural
effusion, or pneumothorax. Osteopenia and mild multilevel
degenerative changes.
IMPRESSION: Prominent cardiomediastinal contours. Aortic atherosclerosis,
tortuosity, and dilatation.

No focal consolidation.

## 2015-04-01 DIAGNOSIS — D500 Iron deficiency anemia secondary to blood loss (chronic): Secondary | ICD-10-CM | POA: Diagnosis not present

## 2015-04-01 DIAGNOSIS — K259 Gastric ulcer, unspecified as acute or chronic, without hemorrhage or perforation: Secondary | ICD-10-CM | POA: Diagnosis not present

## 2015-04-26 DIAGNOSIS — D126 Benign neoplasm of colon, unspecified: Secondary | ICD-10-CM | POA: Diagnosis not present

## 2015-04-26 DIAGNOSIS — K5730 Diverticulosis of large intestine without perforation or abscess without bleeding: Secondary | ICD-10-CM | POA: Diagnosis not present

## 2015-04-26 DIAGNOSIS — K449 Diaphragmatic hernia without obstruction or gangrene: Secondary | ICD-10-CM | POA: Diagnosis not present

## 2015-04-26 DIAGNOSIS — K2970 Gastritis, unspecified, without bleeding: Secondary | ICD-10-CM | POA: Diagnosis not present

## 2015-04-26 DIAGNOSIS — K259 Gastric ulcer, unspecified as acute or chronic, without hemorrhage or perforation: Secondary | ICD-10-CM | POA: Diagnosis not present

## 2015-04-26 DIAGNOSIS — K219 Gastro-esophageal reflux disease without esophagitis: Secondary | ICD-10-CM | POA: Diagnosis not present

## 2015-04-26 DIAGNOSIS — K621 Rectal polyp: Secondary | ICD-10-CM | POA: Diagnosis not present

## 2015-04-26 DIAGNOSIS — D122 Benign neoplasm of ascending colon: Secondary | ICD-10-CM | POA: Diagnosis not present

## 2015-04-26 DIAGNOSIS — K635 Polyp of colon: Secondary | ICD-10-CM | POA: Diagnosis not present

## 2015-04-26 DIAGNOSIS — K589 Irritable bowel syndrome without diarrhea: Secondary | ICD-10-CM | POA: Diagnosis not present

## 2015-04-26 DIAGNOSIS — Z1211 Encounter for screening for malignant neoplasm of colon: Secondary | ICD-10-CM | POA: Diagnosis not present

## 2015-04-26 DIAGNOSIS — K250 Acute gastric ulcer with hemorrhage: Secondary | ICD-10-CM | POA: Diagnosis not present

## 2015-04-26 DIAGNOSIS — E119 Type 2 diabetes mellitus without complications: Secondary | ICD-10-CM | POA: Diagnosis not present

## 2015-04-26 DIAGNOSIS — D123 Benign neoplasm of transverse colon: Secondary | ICD-10-CM | POA: Diagnosis not present

## 2015-05-06 DIAGNOSIS — C679 Malignant neoplasm of bladder, unspecified: Secondary | ICD-10-CM | POA: Diagnosis not present

## 2015-05-06 DIAGNOSIS — N3020 Other chronic cystitis without hematuria: Secondary | ICD-10-CM | POA: Diagnosis not present

## 2015-05-06 DIAGNOSIS — R351 Nocturia: Secondary | ICD-10-CM | POA: Diagnosis not present

## 2015-07-18 DIAGNOSIS — Z96651 Presence of right artificial knee joint: Secondary | ICD-10-CM | POA: Diagnosis not present

## 2015-07-18 DIAGNOSIS — Z471 Aftercare following joint replacement surgery: Secondary | ICD-10-CM | POA: Diagnosis not present

## 2015-08-07 DIAGNOSIS — E782 Mixed hyperlipidemia: Secondary | ICD-10-CM | POA: Diagnosis not present

## 2015-08-07 DIAGNOSIS — E119 Type 2 diabetes mellitus without complications: Secondary | ICD-10-CM | POA: Diagnosis not present

## 2015-08-07 DIAGNOSIS — I10 Essential (primary) hypertension: Secondary | ICD-10-CM | POA: Diagnosis not present

## 2015-11-04 DIAGNOSIS — N3020 Other chronic cystitis without hematuria: Secondary | ICD-10-CM | POA: Diagnosis not present

## 2015-11-04 DIAGNOSIS — R351 Nocturia: Secondary | ICD-10-CM | POA: Diagnosis not present

## 2015-11-04 DIAGNOSIS — C672 Malignant neoplasm of lateral wall of bladder: Secondary | ICD-10-CM | POA: Diagnosis not present

## 2015-11-04 DIAGNOSIS — C679 Malignant neoplasm of bladder, unspecified: Secondary | ICD-10-CM | POA: Diagnosis not present

## 2016-04-07 DIAGNOSIS — Z23 Encounter for immunization: Secondary | ICD-10-CM | POA: Diagnosis not present

## 2016-05-05 DIAGNOSIS — C672 Malignant neoplasm of lateral wall of bladder: Secondary | ICD-10-CM | POA: Diagnosis not present

## 2016-05-05 DIAGNOSIS — N3000 Acute cystitis without hematuria: Secondary | ICD-10-CM | POA: Diagnosis not present

## 2016-07-16 DIAGNOSIS — E782 Mixed hyperlipidemia: Secondary | ICD-10-CM | POA: Diagnosis not present

## 2016-07-16 DIAGNOSIS — I10 Essential (primary) hypertension: Secondary | ICD-10-CM | POA: Diagnosis not present

## 2016-07-16 DIAGNOSIS — Z0000 Encounter for general adult medical examination without abnormal findings: Secondary | ICD-10-CM | POA: Diagnosis not present

## 2016-07-16 DIAGNOSIS — Z9181 History of falling: Secondary | ICD-10-CM | POA: Diagnosis not present

## 2016-07-16 DIAGNOSIS — Z1389 Encounter for screening for other disorder: Secondary | ICD-10-CM | POA: Diagnosis not present

## 2016-07-16 DIAGNOSIS — E119 Type 2 diabetes mellitus without complications: Secondary | ICD-10-CM | POA: Diagnosis not present

## 2016-07-16 DIAGNOSIS — K259 Gastric ulcer, unspecified as acute or chronic, without hemorrhage or perforation: Secondary | ICD-10-CM | POA: Diagnosis not present

## 2016-07-16 DIAGNOSIS — R69 Illness, unspecified: Secondary | ICD-10-CM | POA: Diagnosis not present

## 2016-07-30 DIAGNOSIS — H5203 Hypermetropia, bilateral: Secondary | ICD-10-CM | POA: Diagnosis not present

## 2016-07-30 DIAGNOSIS — H52223 Regular astigmatism, bilateral: Secondary | ICD-10-CM | POA: Diagnosis not present

## 2016-07-30 DIAGNOSIS — H2513 Age-related nuclear cataract, bilateral: Secondary | ICD-10-CM | POA: Diagnosis not present

## 2016-07-30 DIAGNOSIS — H524 Presbyopia: Secondary | ICD-10-CM | POA: Diagnosis not present

## 2016-07-30 DIAGNOSIS — Z0101 Encounter for examination of eyes and vision with abnormal findings: Secondary | ICD-10-CM | POA: Diagnosis not present

## 2016-08-25 DIAGNOSIS — E119 Type 2 diabetes mellitus without complications: Secondary | ICD-10-CM | POA: Diagnosis not present

## 2016-08-25 DIAGNOSIS — Z6839 Body mass index (BMI) 39.0-39.9, adult: Secondary | ICD-10-CM | POA: Diagnosis not present

## 2016-09-29 DIAGNOSIS — N6310 Unspecified lump in the right breast, unspecified quadrant: Secondary | ICD-10-CM | POA: Diagnosis not present

## 2016-09-29 DIAGNOSIS — Z6838 Body mass index (BMI) 38.0-38.9, adult: Secondary | ICD-10-CM | POA: Diagnosis not present

## 2016-10-07 DIAGNOSIS — N6311 Unspecified lump in the right breast, upper outer quadrant: Secondary | ICD-10-CM | POA: Diagnosis not present

## 2016-10-15 DIAGNOSIS — N6011 Diffuse cystic mastopathy of right breast: Secondary | ICD-10-CM | POA: Diagnosis not present

## 2016-10-15 DIAGNOSIS — C50911 Malignant neoplasm of unspecified site of right female breast: Secondary | ICD-10-CM | POA: Diagnosis not present

## 2016-10-15 DIAGNOSIS — C50411 Malignant neoplasm of upper-outer quadrant of right female breast: Secondary | ICD-10-CM | POA: Diagnosis not present

## 2016-10-15 DIAGNOSIS — N6311 Unspecified lump in the right breast, upper outer quadrant: Secondary | ICD-10-CM | POA: Diagnosis not present

## 2016-10-26 DIAGNOSIS — C50411 Malignant neoplasm of upper-outer quadrant of right female breast: Secondary | ICD-10-CM | POA: Diagnosis not present

## 2016-10-26 DIAGNOSIS — Z170 Estrogen receptor positive status [ER+]: Secondary | ICD-10-CM | POA: Diagnosis not present

## 2016-11-04 DIAGNOSIS — E119 Type 2 diabetes mellitus without complications: Secondary | ICD-10-CM | POA: Diagnosis not present

## 2016-11-04 DIAGNOSIS — C50411 Malignant neoplasm of upper-outer quadrant of right female breast: Secondary | ICD-10-CM | POA: Diagnosis not present

## 2016-11-04 DIAGNOSIS — Z170 Estrogen receptor positive status [ER+]: Secondary | ICD-10-CM | POA: Diagnosis not present

## 2016-11-04 DIAGNOSIS — E785 Hyperlipidemia, unspecified: Secondary | ICD-10-CM | POA: Diagnosis not present

## 2016-11-04 DIAGNOSIS — Z6837 Body mass index (BMI) 37.0-37.9, adult: Secondary | ICD-10-CM | POA: Diagnosis not present

## 2016-11-04 DIAGNOSIS — R69 Illness, unspecified: Secondary | ICD-10-CM | POA: Diagnosis not present

## 2016-11-04 DIAGNOSIS — Z87891 Personal history of nicotine dependence: Secondary | ICD-10-CM | POA: Diagnosis not present

## 2016-11-04 DIAGNOSIS — I10 Essential (primary) hypertension: Secondary | ICD-10-CM | POA: Diagnosis not present

## 2016-11-04 DIAGNOSIS — E669 Obesity, unspecified: Secondary | ICD-10-CM | POA: Diagnosis not present

## 2016-11-04 DIAGNOSIS — K219 Gastro-esophageal reflux disease without esophagitis: Secondary | ICD-10-CM | POA: Diagnosis not present

## 2016-11-04 DIAGNOSIS — C50911 Malignant neoplasm of unspecified site of right female breast: Secondary | ICD-10-CM | POA: Diagnosis not present

## 2016-11-06 DIAGNOSIS — Z87891 Personal history of nicotine dependence: Secondary | ICD-10-CM | POA: Diagnosis not present

## 2016-11-06 DIAGNOSIS — I10 Essential (primary) hypertension: Secondary | ICD-10-CM | POA: Diagnosis not present

## 2016-11-06 DIAGNOSIS — R69 Illness, unspecified: Secondary | ICD-10-CM | POA: Diagnosis not present

## 2016-11-06 DIAGNOSIS — Z9011 Acquired absence of right breast and nipple: Secondary | ICD-10-CM | POA: Diagnosis not present

## 2016-11-06 DIAGNOSIS — Z8551 Personal history of malignant neoplasm of bladder: Secondary | ICD-10-CM | POA: Diagnosis not present

## 2016-11-06 DIAGNOSIS — E119 Type 2 diabetes mellitus without complications: Secondary | ICD-10-CM | POA: Diagnosis not present

## 2016-11-06 DIAGNOSIS — Z483 Aftercare following surgery for neoplasm: Secondary | ICD-10-CM | POA: Diagnosis not present

## 2016-11-06 DIAGNOSIS — C50411 Malignant neoplasm of upper-outer quadrant of right female breast: Secondary | ICD-10-CM | POA: Diagnosis not present

## 2016-11-06 DIAGNOSIS — Z96651 Presence of right artificial knee joint: Secondary | ICD-10-CM | POA: Diagnosis not present

## 2016-11-06 DIAGNOSIS — Z170 Estrogen receptor positive status [ER+]: Secondary | ICD-10-CM | POA: Diagnosis not present

## 2016-11-09 DIAGNOSIS — E119 Type 2 diabetes mellitus without complications: Secondary | ICD-10-CM | POA: Diagnosis not present

## 2016-11-09 DIAGNOSIS — Z9011 Acquired absence of right breast and nipple: Secondary | ICD-10-CM | POA: Diagnosis not present

## 2016-11-09 DIAGNOSIS — Z96651 Presence of right artificial knee joint: Secondary | ICD-10-CM | POA: Diagnosis not present

## 2016-11-09 DIAGNOSIS — I10 Essential (primary) hypertension: Secondary | ICD-10-CM | POA: Diagnosis not present

## 2016-11-09 DIAGNOSIS — Z483 Aftercare following surgery for neoplasm: Secondary | ICD-10-CM | POA: Diagnosis not present

## 2016-11-09 DIAGNOSIS — Z8551 Personal history of malignant neoplasm of bladder: Secondary | ICD-10-CM | POA: Diagnosis not present

## 2016-11-09 DIAGNOSIS — Z170 Estrogen receptor positive status [ER+]: Secondary | ICD-10-CM | POA: Diagnosis not present

## 2016-11-09 DIAGNOSIS — C50411 Malignant neoplasm of upper-outer quadrant of right female breast: Secondary | ICD-10-CM | POA: Diagnosis not present

## 2016-11-09 DIAGNOSIS — R69 Illness, unspecified: Secondary | ICD-10-CM | POA: Diagnosis not present

## 2016-11-09 DIAGNOSIS — Z87891 Personal history of nicotine dependence: Secondary | ICD-10-CM | POA: Diagnosis not present

## 2016-11-12 DIAGNOSIS — I10 Essential (primary) hypertension: Secondary | ICD-10-CM | POA: Diagnosis not present

## 2016-11-12 DIAGNOSIS — R69 Illness, unspecified: Secondary | ICD-10-CM | POA: Diagnosis not present

## 2016-11-12 DIAGNOSIS — Z87891 Personal history of nicotine dependence: Secondary | ICD-10-CM | POA: Diagnosis not present

## 2016-11-12 DIAGNOSIS — Z483 Aftercare following surgery for neoplasm: Secondary | ICD-10-CM | POA: Diagnosis not present

## 2016-11-12 DIAGNOSIS — Z8551 Personal history of malignant neoplasm of bladder: Secondary | ICD-10-CM | POA: Diagnosis not present

## 2016-11-12 DIAGNOSIS — Z96651 Presence of right artificial knee joint: Secondary | ICD-10-CM | POA: Diagnosis not present

## 2016-11-12 DIAGNOSIS — E119 Type 2 diabetes mellitus without complications: Secondary | ICD-10-CM | POA: Diagnosis not present

## 2016-11-12 DIAGNOSIS — Z9011 Acquired absence of right breast and nipple: Secondary | ICD-10-CM | POA: Diagnosis not present

## 2016-11-12 DIAGNOSIS — C50411 Malignant neoplasm of upper-outer quadrant of right female breast: Secondary | ICD-10-CM | POA: Diagnosis not present

## 2016-11-12 DIAGNOSIS — Z170 Estrogen receptor positive status [ER+]: Secondary | ICD-10-CM | POA: Diagnosis not present

## 2016-11-16 DIAGNOSIS — Z9011 Acquired absence of right breast and nipple: Secondary | ICD-10-CM | POA: Diagnosis not present

## 2016-11-16 DIAGNOSIS — E119 Type 2 diabetes mellitus without complications: Secondary | ICD-10-CM | POA: Diagnosis not present

## 2016-11-16 DIAGNOSIS — Z8551 Personal history of malignant neoplasm of bladder: Secondary | ICD-10-CM | POA: Diagnosis not present

## 2016-11-16 DIAGNOSIS — Z96651 Presence of right artificial knee joint: Secondary | ICD-10-CM | POA: Diagnosis not present

## 2016-11-16 DIAGNOSIS — Z170 Estrogen receptor positive status [ER+]: Secondary | ICD-10-CM | POA: Diagnosis not present

## 2016-11-16 DIAGNOSIS — R69 Illness, unspecified: Secondary | ICD-10-CM | POA: Diagnosis not present

## 2016-11-16 DIAGNOSIS — Z483 Aftercare following surgery for neoplasm: Secondary | ICD-10-CM | POA: Diagnosis not present

## 2016-11-16 DIAGNOSIS — C50411 Malignant neoplasm of upper-outer quadrant of right female breast: Secondary | ICD-10-CM | POA: Diagnosis not present

## 2016-11-16 DIAGNOSIS — Z87891 Personal history of nicotine dependence: Secondary | ICD-10-CM | POA: Diagnosis not present

## 2016-11-16 DIAGNOSIS — I10 Essential (primary) hypertension: Secondary | ICD-10-CM | POA: Diagnosis not present

## 2016-11-18 DIAGNOSIS — R69 Illness, unspecified: Secondary | ICD-10-CM | POA: Diagnosis not present

## 2016-11-19 DIAGNOSIS — Z9011 Acquired absence of right breast and nipple: Secondary | ICD-10-CM | POA: Diagnosis not present

## 2016-11-19 DIAGNOSIS — Z96651 Presence of right artificial knee joint: Secondary | ICD-10-CM | POA: Diagnosis not present

## 2016-11-19 DIAGNOSIS — I10 Essential (primary) hypertension: Secondary | ICD-10-CM | POA: Diagnosis not present

## 2016-11-19 DIAGNOSIS — Z8551 Personal history of malignant neoplasm of bladder: Secondary | ICD-10-CM | POA: Diagnosis not present

## 2016-11-19 DIAGNOSIS — Z87891 Personal history of nicotine dependence: Secondary | ICD-10-CM | POA: Diagnosis not present

## 2016-11-19 DIAGNOSIS — Z483 Aftercare following surgery for neoplasm: Secondary | ICD-10-CM | POA: Diagnosis not present

## 2016-11-19 DIAGNOSIS — E119 Type 2 diabetes mellitus without complications: Secondary | ICD-10-CM | POA: Diagnosis not present

## 2016-11-19 DIAGNOSIS — R69 Illness, unspecified: Secondary | ICD-10-CM | POA: Diagnosis not present

## 2016-11-19 DIAGNOSIS — C50411 Malignant neoplasm of upper-outer quadrant of right female breast: Secondary | ICD-10-CM | POA: Diagnosis not present

## 2016-11-19 DIAGNOSIS — Z170 Estrogen receptor positive status [ER+]: Secondary | ICD-10-CM | POA: Diagnosis not present

## 2016-12-10 DIAGNOSIS — E119 Type 2 diabetes mellitus without complications: Secondary | ICD-10-CM | POA: Diagnosis not present

## 2016-12-10 DIAGNOSIS — N179 Acute kidney failure, unspecified: Secondary | ICD-10-CM | POA: Diagnosis not present

## 2016-12-10 DIAGNOSIS — Z170 Estrogen receptor positive status [ER+]: Secondary | ICD-10-CM | POA: Diagnosis not present

## 2016-12-10 DIAGNOSIS — R05 Cough: Secondary | ICD-10-CM | POA: Diagnosis not present

## 2016-12-10 DIAGNOSIS — J181 Lobar pneumonia, unspecified organism: Secondary | ICD-10-CM | POA: Diagnosis not present

## 2016-12-10 DIAGNOSIS — N61 Mastitis without abscess: Secondary | ICD-10-CM | POA: Diagnosis not present

## 2016-12-10 DIAGNOSIS — Z853 Personal history of malignant neoplasm of breast: Secondary | ICD-10-CM | POA: Diagnosis not present

## 2016-12-10 DIAGNOSIS — Z87891 Personal history of nicotine dependence: Secondary | ICD-10-CM | POA: Diagnosis not present

## 2016-12-10 DIAGNOSIS — I10 Essential (primary) hypertension: Secondary | ICD-10-CM | POA: Diagnosis not present

## 2016-12-10 DIAGNOSIS — R0989 Other specified symptoms and signs involving the circulatory and respiratory systems: Secondary | ICD-10-CM | POA: Diagnosis not present

## 2016-12-10 DIAGNOSIS — C50911 Malignant neoplasm of unspecified site of right female breast: Secondary | ICD-10-CM | POA: Diagnosis not present

## 2016-12-10 DIAGNOSIS — Z9011 Acquired absence of right breast and nipple: Secondary | ICD-10-CM | POA: Diagnosis not present

## 2016-12-10 DIAGNOSIS — A419 Sepsis, unspecified organism: Secondary | ICD-10-CM | POA: Diagnosis not present

## 2016-12-10 DIAGNOSIS — J9601 Acute respiratory failure with hypoxia: Secondary | ICD-10-CM | POA: Diagnosis not present

## 2016-12-10 DIAGNOSIS — F329 Major depressive disorder, single episode, unspecified: Secondary | ICD-10-CM | POA: Diagnosis not present

## 2016-12-10 DIAGNOSIS — R69 Illness, unspecified: Secondary | ICD-10-CM | POA: Diagnosis not present

## 2016-12-10 DIAGNOSIS — J189 Pneumonia, unspecified organism: Secondary | ICD-10-CM | POA: Diagnosis not present

## 2016-12-10 DIAGNOSIS — R0902 Hypoxemia: Secondary | ICD-10-CM | POA: Diagnosis not present

## 2016-12-10 DIAGNOSIS — E1142 Type 2 diabetes mellitus with diabetic polyneuropathy: Secondary | ICD-10-CM | POA: Diagnosis not present

## 2016-12-11 DIAGNOSIS — J189 Pneumonia, unspecified organism: Secondary | ICD-10-CM | POA: Diagnosis not present

## 2016-12-11 DIAGNOSIS — E119 Type 2 diabetes mellitus without complications: Secondary | ICD-10-CM | POA: Diagnosis not present

## 2016-12-11 DIAGNOSIS — C50911 Malignant neoplasm of unspecified site of right female breast: Secondary | ICD-10-CM | POA: Diagnosis not present

## 2016-12-11 DIAGNOSIS — F329 Major depressive disorder, single episode, unspecified: Secondary | ICD-10-CM | POA: Diagnosis not present

## 2016-12-11 DIAGNOSIS — J9601 Acute respiratory failure with hypoxia: Secondary | ICD-10-CM | POA: Diagnosis not present

## 2016-12-11 DIAGNOSIS — R69 Illness, unspecified: Secondary | ICD-10-CM | POA: Diagnosis not present

## 2016-12-11 DIAGNOSIS — I10 Essential (primary) hypertension: Secondary | ICD-10-CM | POA: Diagnosis not present

## 2016-12-12 DIAGNOSIS — R69 Illness, unspecified: Secondary | ICD-10-CM | POA: Diagnosis not present

## 2016-12-12 DIAGNOSIS — J9601 Acute respiratory failure with hypoxia: Secondary | ICD-10-CM | POA: Diagnosis not present

## 2016-12-12 DIAGNOSIS — F329 Major depressive disorder, single episode, unspecified: Secondary | ICD-10-CM | POA: Diagnosis not present

## 2016-12-12 DIAGNOSIS — J189 Pneumonia, unspecified organism: Secondary | ICD-10-CM | POA: Diagnosis not present

## 2016-12-12 DIAGNOSIS — E119 Type 2 diabetes mellitus without complications: Secondary | ICD-10-CM | POA: Diagnosis not present

## 2016-12-12 DIAGNOSIS — C50911 Malignant neoplasm of unspecified site of right female breast: Secondary | ICD-10-CM | POA: Diagnosis not present

## 2016-12-12 DIAGNOSIS — I10 Essential (primary) hypertension: Secondary | ICD-10-CM | POA: Diagnosis not present

## 2016-12-13 DIAGNOSIS — J9601 Acute respiratory failure with hypoxia: Secondary | ICD-10-CM | POA: Diagnosis not present

## 2016-12-13 DIAGNOSIS — F329 Major depressive disorder, single episode, unspecified: Secondary | ICD-10-CM | POA: Diagnosis not present

## 2016-12-13 DIAGNOSIS — J189 Pneumonia, unspecified organism: Secondary | ICD-10-CM | POA: Diagnosis not present

## 2016-12-13 DIAGNOSIS — I10 Essential (primary) hypertension: Secondary | ICD-10-CM | POA: Diagnosis not present

## 2016-12-13 DIAGNOSIS — C50911 Malignant neoplasm of unspecified site of right female breast: Secondary | ICD-10-CM | POA: Diagnosis not present

## 2016-12-13 DIAGNOSIS — R69 Illness, unspecified: Secondary | ICD-10-CM | POA: Diagnosis not present

## 2016-12-13 DIAGNOSIS — E119 Type 2 diabetes mellitus without complications: Secondary | ICD-10-CM | POA: Diagnosis not present

## 2016-12-21 DIAGNOSIS — E119 Type 2 diabetes mellitus without complications: Secondary | ICD-10-CM | POA: Diagnosis not present

## 2016-12-21 DIAGNOSIS — Z9981 Dependence on supplemental oxygen: Secondary | ICD-10-CM | POA: Diagnosis not present

## 2016-12-21 DIAGNOSIS — J189 Pneumonia, unspecified organism: Secondary | ICD-10-CM | POA: Diagnosis not present

## 2016-12-21 DIAGNOSIS — Z6838 Body mass index (BMI) 38.0-38.9, adult: Secondary | ICD-10-CM | POA: Diagnosis not present

## 2016-12-25 DIAGNOSIS — C50411 Malignant neoplasm of upper-outer quadrant of right female breast: Secondary | ICD-10-CM | POA: Diagnosis not present

## 2016-12-25 DIAGNOSIS — Z9011 Acquired absence of right breast and nipple: Secondary | ICD-10-CM | POA: Diagnosis not present

## 2016-12-25 DIAGNOSIS — Z170 Estrogen receptor positive status [ER+]: Secondary | ICD-10-CM | POA: Diagnosis not present

## 2017-01-04 DIAGNOSIS — Z9981 Dependence on supplemental oxygen: Secondary | ICD-10-CM | POA: Diagnosis not present

## 2017-01-04 DIAGNOSIS — R0902 Hypoxemia: Secondary | ICD-10-CM | POA: Diagnosis not present

## 2017-01-04 DIAGNOSIS — H6590 Unspecified nonsuppurative otitis media, unspecified ear: Secondary | ICD-10-CM | POA: Diagnosis not present

## 2017-01-04 DIAGNOSIS — Z6838 Body mass index (BMI) 38.0-38.9, adult: Secondary | ICD-10-CM | POA: Diagnosis not present

## 2017-01-13 DIAGNOSIS — J189 Pneumonia, unspecified organism: Secondary | ICD-10-CM | POA: Diagnosis not present

## 2017-02-13 DIAGNOSIS — J189 Pneumonia, unspecified organism: Secondary | ICD-10-CM | POA: Diagnosis not present

## 2017-02-25 DIAGNOSIS — R69 Illness, unspecified: Secondary | ICD-10-CM | POA: Diagnosis not present

## 2017-03-15 DIAGNOSIS — Z9981 Dependence on supplemental oxygen: Secondary | ICD-10-CM | POA: Diagnosis not present

## 2017-03-15 DIAGNOSIS — Z23 Encounter for immunization: Secondary | ICD-10-CM | POA: Diagnosis not present

## 2017-03-15 DIAGNOSIS — E119 Type 2 diabetes mellitus without complications: Secondary | ICD-10-CM | POA: Diagnosis not present

## 2017-03-15 DIAGNOSIS — Z6839 Body mass index (BMI) 39.0-39.9, adult: Secondary | ICD-10-CM | POA: Diagnosis not present

## 2017-03-15 DIAGNOSIS — R300 Dysuria: Secondary | ICD-10-CM | POA: Diagnosis not present

## 2017-04-15 DIAGNOSIS — J189 Pneumonia, unspecified organism: Secondary | ICD-10-CM | POA: Diagnosis not present

## 2017-04-26 DIAGNOSIS — C672 Malignant neoplasm of lateral wall of bladder: Secondary | ICD-10-CM | POA: Diagnosis not present

## 2017-04-26 DIAGNOSIS — N3000 Acute cystitis without hematuria: Secondary | ICD-10-CM | POA: Diagnosis not present

## 2017-04-27 DIAGNOSIS — Z79811 Long term (current) use of aromatase inhibitors: Secondary | ICD-10-CM | POA: Diagnosis not present

## 2017-04-27 DIAGNOSIS — Z853 Personal history of malignant neoplasm of breast: Secondary | ICD-10-CM | POA: Diagnosis not present

## 2017-04-27 DIAGNOSIS — Z9011 Acquired absence of right breast and nipple: Secondary | ICD-10-CM | POA: Diagnosis not present

## 2017-04-27 DIAGNOSIS — C50411 Malignant neoplasm of upper-outer quadrant of right female breast: Secondary | ICD-10-CM | POA: Diagnosis not present

## 2017-04-27 DIAGNOSIS — Z170 Estrogen receptor positive status [ER+]: Secondary | ICD-10-CM | POA: Diagnosis not present

## 2017-05-15 DIAGNOSIS — J189 Pneumonia, unspecified organism: Secondary | ICD-10-CM | POA: Diagnosis not present

## 2017-05-24 DIAGNOSIS — R69 Illness, unspecified: Secondary | ICD-10-CM | POA: Diagnosis not present

## 2017-06-15 DIAGNOSIS — J189 Pneumonia, unspecified organism: Secondary | ICD-10-CM | POA: Diagnosis not present

## 2017-07-16 DIAGNOSIS — J189 Pneumonia, unspecified organism: Secondary | ICD-10-CM | POA: Diagnosis not present

## 2017-07-19 DIAGNOSIS — Z0000 Encounter for general adult medical examination without abnormal findings: Secondary | ICD-10-CM | POA: Diagnosis not present

## 2017-07-19 DIAGNOSIS — R0902 Hypoxemia: Secondary | ICD-10-CM | POA: Diagnosis not present

## 2017-07-19 DIAGNOSIS — Z1339 Encounter for screening examination for other mental health and behavioral disorders: Secondary | ICD-10-CM | POA: Diagnosis not present

## 2017-07-19 DIAGNOSIS — E119 Type 2 diabetes mellitus without complications: Secondary | ICD-10-CM | POA: Diagnosis not present

## 2017-07-19 DIAGNOSIS — Z9181 History of falling: Secondary | ICD-10-CM | POA: Diagnosis not present

## 2017-07-19 DIAGNOSIS — Z9981 Dependence on supplemental oxygen: Secondary | ICD-10-CM | POA: Diagnosis not present

## 2017-07-19 DIAGNOSIS — Z6839 Body mass index (BMI) 39.0-39.9, adult: Secondary | ICD-10-CM | POA: Diagnosis not present

## 2017-07-19 DIAGNOSIS — Z1331 Encounter for screening for depression: Secondary | ICD-10-CM | POA: Diagnosis not present

## 2017-07-22 DIAGNOSIS — Z6839 Body mass index (BMI) 39.0-39.9, adult: Secondary | ICD-10-CM | POA: Diagnosis not present

## 2017-07-22 DIAGNOSIS — J189 Pneumonia, unspecified organism: Secondary | ICD-10-CM | POA: Diagnosis not present

## 2017-08-13 DIAGNOSIS — J189 Pneumonia, unspecified organism: Secondary | ICD-10-CM | POA: Diagnosis not present

## 2017-08-21 DIAGNOSIS — R69 Illness, unspecified: Secondary | ICD-10-CM | POA: Diagnosis not present

## 2017-08-30 DIAGNOSIS — C50411 Malignant neoplasm of upper-outer quadrant of right female breast: Secondary | ICD-10-CM | POA: Diagnosis not present

## 2017-08-30 DIAGNOSIS — Z170 Estrogen receptor positive status [ER+]: Secondary | ICD-10-CM | POA: Diagnosis not present

## 2017-08-30 DIAGNOSIS — Z79811 Long term (current) use of aromatase inhibitors: Secondary | ICD-10-CM | POA: Diagnosis not present

## 2017-08-30 DIAGNOSIS — Z853 Personal history of malignant neoplasm of breast: Secondary | ICD-10-CM | POA: Diagnosis not present

## 2017-08-30 DIAGNOSIS — Z9011 Acquired absence of right breast and nipple: Secondary | ICD-10-CM | POA: Diagnosis not present

## 2017-09-13 DIAGNOSIS — J189 Pneumonia, unspecified organism: Secondary | ICD-10-CM | POA: Diagnosis not present

## 2017-09-29 DIAGNOSIS — Z6839 Body mass index (BMI) 39.0-39.9, adult: Secondary | ICD-10-CM | POA: Diagnosis not present

## 2017-09-29 DIAGNOSIS — J40 Bronchitis, not specified as acute or chronic: Secondary | ICD-10-CM | POA: Diagnosis not present

## 2017-09-29 DIAGNOSIS — Z9981 Dependence on supplemental oxygen: Secondary | ICD-10-CM | POA: Diagnosis not present

## 2017-09-29 DIAGNOSIS — R0902 Hypoxemia: Secondary | ICD-10-CM | POA: Diagnosis not present

## 2017-10-13 DIAGNOSIS — J189 Pneumonia, unspecified organism: Secondary | ICD-10-CM | POA: Diagnosis not present

## 2017-10-18 DIAGNOSIS — Z1231 Encounter for screening mammogram for malignant neoplasm of breast: Secondary | ICD-10-CM | POA: Diagnosis not present

## 2017-10-25 DIAGNOSIS — C50411 Malignant neoplasm of upper-outer quadrant of right female breast: Secondary | ICD-10-CM | POA: Diagnosis not present

## 2017-10-25 DIAGNOSIS — Z6838 Body mass index (BMI) 38.0-38.9, adult: Secondary | ICD-10-CM | POA: Diagnosis not present

## 2017-10-25 DIAGNOSIS — Z1231 Encounter for screening mammogram for malignant neoplasm of breast: Secondary | ICD-10-CM | POA: Diagnosis not present

## 2017-10-25 DIAGNOSIS — D485 Neoplasm of uncertain behavior of skin: Secondary | ICD-10-CM | POA: Diagnosis not present

## 2017-10-25 DIAGNOSIS — Z170 Estrogen receptor positive status [ER+]: Secondary | ICD-10-CM | POA: Diagnosis not present

## 2017-11-02 ENCOUNTER — Institutional Professional Consult (permissible substitution): Payer: Medicare HMO | Admitting: Emergency Medicine

## 2017-11-04 ENCOUNTER — Ambulatory Visit: Payer: Medicare HMO | Admitting: Emergency Medicine

## 2017-11-04 ENCOUNTER — Encounter: Payer: Self-pay | Admitting: Emergency Medicine

## 2017-11-04 DIAGNOSIS — J9611 Chronic respiratory failure with hypoxia: Secondary | ICD-10-CM | POA: Diagnosis not present

## 2017-11-04 NOTE — Assessment & Plan Note (Signed)
Diagnosis was made in the setting of a pneumonia and following a surgical procedure.  Unclear whether she still requires oxygen.  We can establish this today with resting and walking oximetry.  Also consider nocturnal hypoxemia.  She has a body habitus and symptoms that could be consistent with sleep apnea.  She may very well need a sleep study.  I will perform an overnight oximetry.  If she does desaturate we will order testing as appropriate, chest x-ray, pulmonary function testing etc.

## 2017-11-04 NOTE — Progress Notes (Signed)
Subjective:    Patient ID: Renee Hernandez, female    DOB: Nov 24, 1932, 84 y.o.   MRN: [id number]   HPI 84 year old woman with a minimal tobacco history (7 pack years), bladder CA, hypertension, arthritis, right sided breast cancer status post mastectomy 10/2016.  She has received adjuvant anti-hormonal therapy. Her post-op course was c/b a PNA. She was readmitted and treated, required supplemental O2. She is still wearing it at times, but less reliably. She does wear it at night. She has rare cough, impacted by her allergies, also some at night. She snores. She denies any significant dyspnea, although her family is concerned that she does have SOB w exertion.    Review of Systems  Constitutional: Negative for fever and unexpected weight change.  HENT: Negative for congestion, dental problem, ear pain, nosebleeds, postnasal drip, rhinorrhea, sinus pressure, sneezing, sore throat and trouble swallowing.   Eyes: Negative for redness and itching.  Respiratory: Positive for cough and shortness of breath. Negative for chest tightness and wheezing.   Cardiovascular: Negative for palpitations and leg swelling.  Gastrointestinal: Negative for nausea and vomiting.  Genitourinary: Negative for dysuria.  Musculoskeletal: Negative for joint swelling.  Skin: Negative for rash.  Neurological: Negative for headaches.  Hematological: Does not bruise/bleed easily.  Psychiatric/Behavioral: Negative for dysphoric mood. The patient is nervous/anxious.    Past Medical History:  Diagnosis Date  . Arthritis   . Bladder cancer ([place])   . Cancer ([place])   . Depression   . Hypertension      No family history on file.   Social History   Socioeconomic History  . Marital status: Married    Spouse name: Not on file  . Number of children: Not on file  . Years of education: Not on file  . Highest education level: Not on file  Occupational History  . Not on file  Social Needs  . Financial resource strain: Not  on file  . Food insecurity:    Worry: Not on file    Inability: Not on file  . Transportation needs:    Medical: Not on file    Non-medical: Not on file  Tobacco Use  . Smoking status: Former Smoker    Packs/day: 0.50    Years: 14.00    Pack years: 7.00    Types: Cigarettes    Last attempt to quit: 06/08/1968    Years since quitting: 49.4  . Smokeless tobacco: Never Used  . Tobacco comment: quit smoking in 1970  Substance and Sexual Activity  . Alcohol use: No  . Drug use: No  . Sexual activity: Never  Lifestyle  . Physical activity:    Days per week: Not on file    Minutes per session: Not on file  . Stress: Not on file  Relationships  . Social connections:    Talks on phone: Not on file    Gets together: Not on file    Attends religious service: Not on file    Active member of club or organization: Not on file    Attends meetings of clubs or organizations: Not on file    Relationship status: Not on file  . Intimate partner violence:    Fear of current or ex partner: Not on file    Emotionally abused: Not on file    Physically abused: Not on file    Forced sexual activity: Not on file  Other Topics Concern  . Not on file  Social History Narrative  .  Not on file     Allergies  Allergen Reactions  . Aspirin Other (See Comments)    ulcers  . Penicillins Swelling    Tongue swelled badly     Outpatient Medications Prior to Visit  Medication Sig Dispense Refill  . anastrozole (ARIMIDEX) 1 MG tablet Take 1 mg by mouth daily.    Marland Kitchen enoxaparin (LOVENOX) 40 MG/0.4ML injection Inject 0.4 mLs (40 mg total) into the skin daily. 11 Syringe 0  . FLUoxetine (PROZAC) 10 MG tablet Take 10 mg by mouth daily.    . furosemide (LASIX) 20 MG tablet Take 20 mg by mouth daily.    Marland Kitchen losartan (COZAAR) 100 MG tablet Take 100 mg by mouth daily.    . pantoprazole (PROTONIX) 20 MG tablet Take 20 mg by mouth daily.    . sitaGLIPtin (JANUVIA) 100 MG tablet Take 100 mg by mouth daily.    Marland Kitchen  FLUoxetine (PROZAC) 20 MG capsule Take 20 mg by mouth daily.    Marland Kitchen HYDROcodone-acetaminophen (NORCO/VICODIN) 5-325 MG per tablet Take 1-2 tablets by mouth every 4 (four) hours as needed for moderate pain. 90 tablet 0  . losartan-hydrochlorothiazide (HYZAAR) 100-25 MG per tablet Take 1 tablet by mouth daily.    . methocarbamol (ROBAXIN) 500 MG tablet Take 1 tablet (500 mg total) by mouth every 6 (six) hours as needed for muscle spasms. 60 tablet 2   No facility-administered medications prior to visit.         Objective:   Physical Exam Vitals:   11/04/17 1355  BP: 118/70  Pulse: 80  SpO2: 95%  Weight: 227 lb (103 kg)  Height: 5\' 5"  (1.651 m)   Gen: Pleasant, overwt, in no distress,  normal affect  ENT: No lesions,  mouth clear,  oropharynx clear, no postnasal drip  Neck: No JVD, no stridor  Lungs: No use of accessory muscles, decreased at based, otherwise clear  Cardiovascular: RRR, heart sounds normal, no murmur or gallops, no peripheral edema  Musculoskeletal: No deformities, no cyanosis or clubbing  Neuro: alert, non focal  Skin: Warm, no lesions or rash    Assessment & Plan:  Chronic hypoxemic respiratory failure (HCC) Diagnosis was made in the setting of a pneumonia and following a surgical procedure.  Unclear whether she still requires oxygen.  We can establish this today with resting and walking oximetry.  Also consider nocturnal hypoxemia.  She has a body habitus and symptoms that could be consistent with sleep apnea.  She may very well need a sleep study.  I will perform an overnight oximetry.  If she does desaturate we will order testing as appropriate, chest x-ray, pulmonary function testing etc.  Baltazar Apo, MD, PhD 11/04/2017, 2:30 PM [facility] [phone] or if no answer [phone]

## 2017-11-04 NOTE — Patient Instructions (Signed)
We will check your oxygen levels at rest and while walking today.  We will check your oxygen on room air while sleeping Depending on these results we will decide whether we can get rid of the oxygen, and which testing you might need to further evaluate your breathing. This might include CXR, sleep study, breathing tests.  Follow with Dr Lamonte Sakai next available after your overnight oxygen testing

## 2017-11-09 ENCOUNTER — Encounter: Payer: Self-pay | Admitting: Emergency Medicine

## 2017-11-13 DIAGNOSIS — J189 Pneumonia, unspecified organism: Secondary | ICD-10-CM | POA: Diagnosis not present

## 2017-11-15 DIAGNOSIS — D485 Neoplasm of uncertain behavior of skin: Secondary | ICD-10-CM | POA: Diagnosis not present

## 2017-11-15 DIAGNOSIS — C44622 Squamous cell carcinoma of skin of right upper limb, including shoulder: Secondary | ICD-10-CM | POA: Diagnosis not present

## 2017-12-06 DIAGNOSIS — C44622 Squamous cell carcinoma of skin of right upper limb, including shoulder: Secondary | ICD-10-CM | POA: Diagnosis not present

## 2017-12-13 DIAGNOSIS — J189 Pneumonia, unspecified organism: Secondary | ICD-10-CM | POA: Diagnosis not present

## 2017-12-16 DIAGNOSIS — Z170 Estrogen receptor positive status [ER+]: Secondary | ICD-10-CM | POA: Diagnosis not present

## 2017-12-16 DIAGNOSIS — Z9011 Acquired absence of right breast and nipple: Secondary | ICD-10-CM | POA: Diagnosis not present

## 2017-12-16 DIAGNOSIS — Z79811 Long term (current) use of aromatase inhibitors: Secondary | ICD-10-CM | POA: Diagnosis not present

## 2017-12-16 DIAGNOSIS — Z853 Personal history of malignant neoplasm of breast: Secondary | ICD-10-CM | POA: Diagnosis not present

## 2017-12-16 DIAGNOSIS — C50411 Malignant neoplasm of upper-outer quadrant of right female breast: Secondary | ICD-10-CM | POA: Diagnosis not present

## 2017-12-28 ENCOUNTER — Ambulatory Visit: Payer: Medicare HMO | Admitting: Emergency Medicine

## 2018-01-13 DIAGNOSIS — J189 Pneumonia, unspecified organism: Secondary | ICD-10-CM | POA: Diagnosis not present

## 2018-01-18 ENCOUNTER — Encounter: Payer: Self-pay | Admitting: Emergency Medicine

## 2018-01-18 ENCOUNTER — Ambulatory Visit: Payer: Medicare HMO | Admitting: Emergency Medicine

## 2018-01-18 DIAGNOSIS — J9611 Chronic respiratory failure with hypoxia: Secondary | ICD-10-CM | POA: Diagnosis not present

## 2018-01-18 NOTE — Progress Notes (Signed)
Subjective:    Patient ID: Renee Hernandez, female    DOB: 1932/09/23, 84 y.o.   MRN: [id number]   HPI 84 year old woman with a minimal tobacco history (7 pack years), bladder CA, hypertension, arthritis, right sided breast cancer status post mastectomy 10/2016.  She has received adjuvant anti-hormonal therapy. Her post-op course was c/b a PNA. She was readmitted and treated, required supplemental O2. She is still wearing it at times, but less reliably. She does wear it at night. She has rare cough, impacted by her allergies, also some at night. She snores. She denies any significant dyspnea, although her family is concerned that she does have SOB w exertion.   ROV 01/18/18 --this is a follow-up visit for patient with a history of obesity, letter cancer and right-sided breast cancer.  She was readmitted and treated for pneumonia about 1 year ago and was found to be hypoxemic at that time.  I saw her to evaluate her oxygen need in May of this year.  She did not desaturate with ambulation.  We performed an overnight oximetry that shows . She has been feeling well. She is up and around more, does some home chores. She naps frequently, snores. She can fall asleep watching TV. She is not sure she wants to pursue CPAP or OSA.     Review of Systems  Constitutional: Negative for fever and unexpected weight change.  HENT: Negative for congestion, dental problem, ear pain, nosebleeds, postnasal drip, rhinorrhea, sinus pressure, sneezing, sore throat and trouble swallowing.   Eyes: Negative for redness and itching.  Respiratory: Positive for cough and shortness of breath. Negative for chest tightness and wheezing.   Cardiovascular: Negative for palpitations and leg swelling.  Gastrointestinal: Negative for nausea and vomiting.  Genitourinary: Negative for dysuria.  Musculoskeletal: Negative for joint swelling.  Skin: Negative for rash.  Neurological: Negative for headaches.  Hematological: Does not  bruise/bleed easily.  Psychiatric/Behavioral: Negative for dysphoric mood. The patient is nervous/anxious.    Past Medical History:  Diagnosis Date  . Arthritis   . Bladder cancer ([place])   . Cancer ([place])   . Depression   . Hypertension      No family history on file.   Social History   Socioeconomic History  . Marital status: Married    Spouse name: Not on file  . Number of children: Not on file  . Years of education: Not on file  . Highest education level: Not on file  Occupational History  . Not on file  Social Needs  . Financial resource strain: Not on file  . Food insecurity:    Worry: Not on file    Inability: Not on file  . Transportation needs:    Medical: Not on file    Non-medical: Not on file  Tobacco Use  . Smoking status: Former Smoker    Packs/day: 0.50    Years: 14.00    Pack years: 7.00    Types: Cigarettes    Last attempt to quit: 06/08/1968    Years since quitting: 49.6  . Smokeless tobacco: Never Used  . Tobacco comment: quit smoking in 1970  Substance and Sexual Activity  . Alcohol use: No  . Drug use: No  . Sexual activity: Never  Lifestyle  . Physical activity:    Days per week: Not on file    Minutes per session: Not on file  . Stress: Not on file  Relationships  . Social connections:    Talks on  phone: Not on file    Gets together: Not on file    Attends religious service: Not on file    Active member of club or organization: Not on file    Attends meetings of clubs or organizations: Not on file    Relationship status: Not on file  . Intimate partner violence:    Fear of current or ex partner: Not on file    Emotionally abused: Not on file    Physically abused: Not on file    Forced sexual activity: Not on file  Other Topics Concern  . Not on file  Social History Narrative  . Not on file     Allergies  Allergen Reactions  . Aspirin Other (See Comments)    ulcers  . Penicillins Swelling    Tongue swelled badly      Outpatient Medications Prior to Visit  Medication Sig Dispense Refill  . anastrozole (ARIMIDEX) 1 MG tablet Take 1 mg by mouth daily.    Marland Kitchen enoxaparin (LOVENOX) 40 MG/0.4ML injection Inject 0.4 mLs (40 mg total) into the skin daily. 11 Syringe 0  . FLUoxetine (PROZAC) 10 MG tablet Take 10 mg by mouth daily.    . furosemide (LASIX) 20 MG tablet Take 20 mg by mouth daily.    Marland Kitchen losartan (COZAAR) 100 MG tablet Take 100 mg by mouth daily.    . pantoprazole (PROTONIX) 20 MG tablet Take 20 mg by mouth daily.    . sitaGLIPtin (JANUVIA) 100 MG tablet Take 100 mg by mouth daily.     No facility-administered medications prior to visit.         Objective:   Physical Exam Vitals:   01/18/18 1331  BP: 136/88  Pulse: 62  SpO2: 95%  Weight: 227 lb (103 kg)   Gen: Pleasant, overwt, in no distress,  normal affect, wheelchair  ENT: No lesions,  mouth clear,  oropharynx clear, no postnasal drip  Neck: No JVD, no stridor  Lungs: No use of accessory muscles, R basilar insp crackles, L clear  Cardiovascular: RRR, heart sounds normal, no murmur or gallops, no peripheral edema  Musculoskeletal: No deformities, no cyanosis or clubbing  Neuro: alert, non focal  Skin: Warm, no lesions or rash    Assessment & Plan:  Chronic hypoxemic respiratory failure (HCC) Improved since she got over her pneumonia.  She does still have some right lower lobe crackles.  She did not desaturate with ambulation.  We will review her ambulatory oximetry and determine whether she desaturates.  If so we will need to make a decision about nocturnal oxygen versus a polysomnogram.  I did broach the subject with her today and she is not very interested in CPAP or a sleep study.  She may refuse no matter what the oximetry shows.  We will call her and review the options.  If she would like for [place] to discontinue her home oxygen then we will do so.  Baltazar Apo, MD, PhD 01/18/2018, 1:55 PM [facility] [phone] or if no answer [phone]

## 2018-01-18 NOTE — Patient Instructions (Addendum)
Your walking oxygen levels are normal.  We will review your sleeping oxygen levels. If normal, then we will discontinue your oxygen altogether. If you have low oxygen levels at night, then you will have to decide whether you are willing to wear oxygen at night while sleeping and whether you would like to pursue a sleep study.  We will call you you to discuss and to decide whether we need to follow up.

## 2018-01-18 NOTE — Assessment & Plan Note (Signed)
Improved since she got over her pneumonia.  She does still have some right lower lobe crackles.  She did not desaturate with ambulation.  We will review her ambulatory oximetry and determine whether she desaturates.  If so we will need to make a decision about nocturnal oxygen versus a polysomnogram.  I did broach the subject with her today and she is not very interested in CPAP or a sleep study.  She may refuse no matter what the oximetry shows.  We will call her and review the options.  If she would like for [place] to discontinue her home oxygen then we will do so.

## 2018-01-20 ENCOUNTER — Telehealth: Payer: Self-pay | Admitting: Emergency Medicine

## 2018-01-20 NOTE — Telephone Encounter (Signed)
Per RB >> pt will need to continue to wear her oxygen at night time. Would pt be interested in doing a sleep study?  Spoke with pt's daughter, Vaughan Basta. She is aware of the pt's results. Vaughan Basta states that the pt would not be interested in doing a sleep study. Nothing further was needed.

## 2018-01-31 DIAGNOSIS — Z6838 Body mass index (BMI) 38.0-38.9, adult: Secondary | ICD-10-CM | POA: Diagnosis not present

## 2018-01-31 DIAGNOSIS — K259 Gastric ulcer, unspecified as acute or chronic, without hemorrhage or perforation: Secondary | ICD-10-CM | POA: Diagnosis not present

## 2018-01-31 DIAGNOSIS — R69 Illness, unspecified: Secondary | ICD-10-CM | POA: Diagnosis not present

## 2018-01-31 DIAGNOSIS — Z79899 Other long term (current) drug therapy: Secondary | ICD-10-CM | POA: Diagnosis not present

## 2018-01-31 DIAGNOSIS — E119 Type 2 diabetes mellitus without complications: Secondary | ICD-10-CM | POA: Diagnosis not present

## 2018-01-31 DIAGNOSIS — I10 Essential (primary) hypertension: Secondary | ICD-10-CM | POA: Diagnosis not present

## 2018-02-13 DIAGNOSIS — J189 Pneumonia, unspecified organism: Secondary | ICD-10-CM | POA: Diagnosis not present

## 2018-02-16 DIAGNOSIS — J40 Bronchitis, not specified as acute or chronic: Secondary | ICD-10-CM | POA: Diagnosis not present

## 2018-02-16 DIAGNOSIS — Z6838 Body mass index (BMI) 38.0-38.9, adult: Secondary | ICD-10-CM | POA: Diagnosis not present

## 2018-03-15 DIAGNOSIS — J189 Pneumonia, unspecified organism: Secondary | ICD-10-CM | POA: Diagnosis not present

## 2018-04-13 DIAGNOSIS — Z23 Encounter for immunization: Secondary | ICD-10-CM | POA: Diagnosis not present

## 2018-04-15 DIAGNOSIS — J189 Pneumonia, unspecified organism: Secondary | ICD-10-CM | POA: Diagnosis not present

## 2018-04-18 DIAGNOSIS — Z79811 Long term (current) use of aromatase inhibitors: Secondary | ICD-10-CM | POA: Diagnosis not present

## 2018-04-18 DIAGNOSIS — Z170 Estrogen receptor positive status [ER+]: Secondary | ICD-10-CM | POA: Diagnosis not present

## 2018-04-18 DIAGNOSIS — C50411 Malignant neoplasm of upper-outer quadrant of right female breast: Secondary | ICD-10-CM | POA: Diagnosis not present

## 2018-04-18 DIAGNOSIS — Z853 Personal history of malignant neoplasm of breast: Secondary | ICD-10-CM | POA: Diagnosis not present

## 2018-04-18 DIAGNOSIS — Z9011 Acquired absence of right breast and nipple: Secondary | ICD-10-CM | POA: Diagnosis not present

## 2018-04-26 DIAGNOSIS — N3020 Other chronic cystitis without hematuria: Secondary | ICD-10-CM | POA: Diagnosis not present

## 2018-04-26 DIAGNOSIS — C672 Malignant neoplasm of lateral wall of bladder: Secondary | ICD-10-CM | POA: Diagnosis not present

## 2018-05-15 DIAGNOSIS — J189 Pneumonia, unspecified organism: Secondary | ICD-10-CM | POA: Diagnosis not present

## 2018-06-15 DIAGNOSIS — J189 Pneumonia, unspecified organism: Secondary | ICD-10-CM | POA: Diagnosis not present

## 2018-07-15 DIAGNOSIS — J9601 Acute respiratory failure with hypoxia: Secondary | ICD-10-CM | POA: Diagnosis not present

## 2018-07-15 DIAGNOSIS — R072 Precordial pain: Secondary | ICD-10-CM | POA: Diagnosis not present

## 2018-07-15 DIAGNOSIS — J810 Acute pulmonary edema: Secondary | ICD-10-CM | POA: Diagnosis not present

## 2018-07-15 DIAGNOSIS — R0603 Acute respiratory distress: Secondary | ICD-10-CM | POA: Diagnosis not present

## 2018-07-15 DIAGNOSIS — K6289 Other specified diseases of anus and rectum: Secondary | ICD-10-CM | POA: Diagnosis not present

## 2018-07-15 DIAGNOSIS — I10 Essential (primary) hypertension: Secondary | ICD-10-CM | POA: Diagnosis not present

## 2018-07-15 DIAGNOSIS — J189 Pneumonia, unspecified organism: Secondary | ICD-10-CM | POA: Diagnosis not present

## 2018-07-15 DIAGNOSIS — J90 Pleural effusion, not elsewhere classified: Secondary | ICD-10-CM | POA: Diagnosis not present

## 2018-07-15 DIAGNOSIS — G9349 Other encephalopathy: Secondary | ICD-10-CM | POA: Diagnosis not present

## 2018-07-15 DIAGNOSIS — G4733 Obstructive sleep apnea (adult) (pediatric): Secondary | ICD-10-CM | POA: Diagnosis not present

## 2018-07-15 DIAGNOSIS — I4510 Unspecified right bundle-branch block: Secondary | ICD-10-CM | POA: Diagnosis not present

## 2018-07-15 DIAGNOSIS — R918 Other nonspecific abnormal finding of lung field: Secondary | ICD-10-CM | POA: Diagnosis not present

## 2018-07-15 DIAGNOSIS — D72829 Elevated white blood cell count, unspecified: Secondary | ICD-10-CM | POA: Diagnosis not present

## 2018-07-15 DIAGNOSIS — Z8674 Personal history of sudden cardiac arrest: Secondary | ICD-10-CM | POA: Diagnosis not present

## 2018-07-15 DIAGNOSIS — S2239XA Fracture of one rib, unspecified side, initial encounter for closed fracture: Secondary | ICD-10-CM | POA: Diagnosis not present

## 2018-07-15 DIAGNOSIS — I4581 Long QT syndrome: Secondary | ICD-10-CM | POA: Diagnosis not present

## 2018-07-15 DIAGNOSIS — Z886 Allergy status to analgesic agent status: Secondary | ICD-10-CM | POA: Diagnosis not present

## 2018-07-15 DIAGNOSIS — S2243XA Multiple fractures of ribs, bilateral, initial encounter for closed fracture: Secondary | ICD-10-CM | POA: Diagnosis not present

## 2018-07-15 DIAGNOSIS — R05 Cough: Secondary | ICD-10-CM | POA: Diagnosis not present

## 2018-07-15 DIAGNOSIS — E669 Obesity, unspecified: Secondary | ICD-10-CM | POA: Diagnosis not present

## 2018-07-15 DIAGNOSIS — E119 Type 2 diabetes mellitus without complications: Secondary | ICD-10-CM | POA: Diagnosis not present

## 2018-07-15 DIAGNOSIS — R0902 Hypoxemia: Secondary | ICD-10-CM | POA: Diagnosis not present

## 2018-07-15 DIAGNOSIS — G9340 Encephalopathy, unspecified: Secondary | ICD-10-CM | POA: Diagnosis not present

## 2018-07-15 DIAGNOSIS — S0990XA Unspecified injury of head, initial encounter: Secondary | ICD-10-CM | POA: Diagnosis not present

## 2018-07-15 DIAGNOSIS — I499 Cardiac arrhythmia, unspecified: Secondary | ICD-10-CM | POA: Diagnosis not present

## 2018-07-15 DIAGNOSIS — J811 Chronic pulmonary edema: Secondary | ICD-10-CM | POA: Diagnosis not present

## 2018-07-15 DIAGNOSIS — I469 Cardiac arrest, cause unspecified: Secondary | ICD-10-CM | POA: Diagnosis not present

## 2018-07-15 DIAGNOSIS — B370 Candidal stomatitis: Secondary | ICD-10-CM | POA: Diagnosis not present

## 2018-07-15 DIAGNOSIS — R0789 Other chest pain: Secondary | ICD-10-CM | POA: Diagnosis not present

## 2018-07-15 DIAGNOSIS — R0989 Other specified symptoms and signs involving the circulatory and respiratory systems: Secondary | ICD-10-CM | POA: Diagnosis not present

## 2019-03-28 DIAGNOSIS — Z23 Encounter for immunization: Secondary | ICD-10-CM | POA: Diagnosis not present

## 2019-10-24 DIAGNOSIS — C44622 Squamous cell carcinoma of skin of right upper limb, including shoulder: Secondary | ICD-10-CM | POA: Diagnosis not present

## 2021-08-06 DEATH — deceased
